# Patient Record
Sex: Female | Born: 1995 | Race: White | Hispanic: No | Marital: Single | Smoking: Never smoker
Health system: Southern US, Community
[De-identification: ages and names within clinical notes are randomized; demographics above are authoritative.]

## PROBLEM LIST (undated history)

## (undated) DIAGNOSIS — F419 Anxiety disorder, unspecified: Secondary | ICD-10-CM

## (undated) DIAGNOSIS — F32A Depression, unspecified: Secondary | ICD-10-CM

## (undated) HISTORY — PX: TYMPANOSTOMY TUBE PLACEMENT: SHX32

## (undated) HISTORY — DX: Depression, unspecified: F32.A

## (undated) HISTORY — PX: WISDOM TOOTH EXTRACTION: SHX21

---
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
## 2017-01-08 ENCOUNTER — Encounter (HOSPITAL_COMMUNITY): Payer: Self-pay | Admitting: Emergency Medicine

## 2017-01-08 DIAGNOSIS — R079 Chest pain, unspecified: Secondary | ICD-10-CM | POA: Diagnosis present

## 2017-01-08 DIAGNOSIS — R0789 Other chest pain: Secondary | ICD-10-CM | POA: Insufficient documentation

## 2017-01-08 DIAGNOSIS — Z79899 Other long term (current) drug therapy: Secondary | ICD-10-CM | POA: Insufficient documentation

## 2017-01-08 NOTE — ED Triage Notes (Signed)
Pt states that she was started on minipress for nightmares on Thursday and had a pounding heartbeat at that time. Also states that her prozac was doubled. States that she has had chest pain x 3 hours and a heavy feeling in her L arm. Alert and oriented.

## 2017-01-09 ENCOUNTER — Emergency Department (HOSPITAL_COMMUNITY)
Admission: EM | Admit: 2017-01-09 | Discharge: 2017-01-09 | Disposition: A | Discharge status: Home / Self Care | Payer: 59 | Attending: Emergency Medicine | Admitting: Emergency Medicine

## 2017-01-09 DIAGNOSIS — R0789 Other chest pain: Secondary | ICD-10-CM

## 2017-01-09 HISTORY — DX: Anxiety disorder, unspecified: F41.9

## 2017-01-09 NOTE — ED Notes (Signed)
Bed: WA14 Expected date:  Expected time:  Means of arrival:  Comments: TR 

## 2017-01-09 NOTE — ED Provider Notes (Signed)
[facility] DEPT Provider Note: Anne Dell, MD, FACEP  CSN: [id number] MRN: [id number] ARRIVAL: 01/08/17 at 2040 ROOM: WA14/WA14   CHIEF COMPLAINT  Chest Pain   HISTORY OF PRESENT ILLNESS  Anne Duarte is a 21 y.o. female who was started on Minipress for nightmares 6 days ago. She developed heart palpitations, by which she means a pounding heartbeat, and immediately discontinued the drug. She also had her Prozac dose doubled from 20 milligrams daily to 40 milligrams daily. She has not had Minipress since that first dose.  She is here with chest pain that began yesterday evening about 6 PM. The pain was located in the left upper chest. It was well localized. It was mild and dull in nature. It was somewhat worse with expiration. There was no shortness of breath but she did feel her chest and left arm felt heavier than usual. There was no nausea or diaphoresis. The chest pain resolved on its own about 1 AM and she has not had any since. She has no history of mitral valve prolapse or similar pain.   Past Medical History:  Diagnosis Date  . Anxiety     Past Surgical History:  Procedure Laterality Date  . TYMPANOSTOMY TUBE PLACEMENT    . WISDOM TOOTH EXTRACTION      No family history on file.  Social History  Substance Use Topics  . Smoking status: Not on file  . Smokeless tobacco: Not on file  . Alcohol use Not on file    Prior to Admission medications   Medication Sig Start Date End Date Taking? Authorizing Provider  acetaminophen (TYLENOL) 500 MG tablet Take 1,000 mg by mouth every 6 (six) hours as needed for moderate pain.   Yes Historical Provider, MD  FLUoxetine (PROZAC) 40 MG capsule Take 40 mg by mouth daily.   Yes Historical Provider, MD    Allergies Prazosin hcl   REVIEW OF SYSTEMS  Negative except as noted here or in the History of Present Illness.   PHYSICAL EXAMINATION  Initial Vital Signs Blood pressure 106/74, pulse 67, temperature 98.3 F (36.8  C), temperature source Oral, resp. rate 19, last menstrual period 01/07/2017, SpO2 100 %.  Examination General: Well-developed, well-nourished female in no acute distress; appearance consistent with age of record HENT: normocephalic; atraumatic Eyes: pupils equal, round and reactive to light; extraocular muscles intact Neck: supple Heart: regular rate and rhythm; no murmurs, rubs or gallops Lungs: clear to auscultation bilaterally Abdomen: soft; nondistended; nontender; no masses or hepatosplenomegaly; bowel sounds present Extremities: No deformity; full range of motion; pulses normal Neurologic: Awake, alert and oriented; motor function intact in all extremities and symmetric; no facial droop Skin: Warm and dry Psychiatric: Normal mood and affect   RESULTS  Summary of this visit's results, reviewed by myself:   EKG Interpretation  Date/Time:  Tuesday January 08 2017 20:59:24 EDT Ventricular Rate:  90 PR Interval:    QRS Duration: 85 QT Interval:  361 QTC Calculation: 442 R Axis:   59 Text Interpretation:  Sinus rhythm RSR' in V1 or V2, right VCD or RVH Borderline T abnormalities, anterior leads Previously normal Confirmed by Read Drivers  MD, Jonny Ruiz ([id number]) on 01/09/2017 4:46:13 AM       EKG Interpretation  Date/Time:  Wednesday January 09 2017 04:55:12 EDT Ventricular Rate:  73 PR Interval:    QRS Duration: 92 QT Interval:  392 QTC Calculation: 432 R Axis:   48 Text Interpretation:  Sinus rhythm RSR' in V1 or V2,  right VCD or RVH No significant change was found Confirmed by Farha Dano  MD, Jonny RuizJOHN ([id number]) on 01/09/2017 5:03:02 AM       Laboratory Studies: No results found for this or any previous visit (from the past 24 hour(s)). Imaging Studies: No results found.  ED COURSE  Nursing notes and initial vitals signs, including pulse oximetry, reviewed.  Vitals:   01/08/17 2325 01/09/17 0226 01/09/17 0335 01/09/17 0400  BP: 106/65 108/71  106/74  Pulse: 76 80  67  Resp: 18 18  19    Temp: 98.4 F (36.9 C) 98.3 F (36.8 C)    TempSrc: Oral Oral    SpO2: 99% 99% 99% 100%   5:04 AM Patient's chest pain is atypical for coronary pain and she is an otherwise healthy young female with no significant risk factors. The location and nature the pain are more consistent with mitral valve prolapse. We will refer to cardiology for outpatient evaluation.  PROCEDURES    ED DIAGNOSES     ICD-9-CM ICD-10-CM   1. Atypical chest pain 786.59 R07.89        Paula LibraJohn Chivonne Rascon, MD 01/09/17 [phone]

## 2017-07-03 ENCOUNTER — Emergency Department (HOSPITAL_COMMUNITY)
Admission: EM | Admit: 2017-07-03 | Discharge: 2017-07-04 | Disposition: A | Discharge status: Other Acute Inpatient Hospital | Payer: 59 | Attending: Emergency Medicine | Admitting: Emergency Medicine

## 2017-07-03 ENCOUNTER — Encounter (HOSPITAL_COMMUNITY): Payer: Self-pay | Admitting: *Deleted

## 2017-07-03 DIAGNOSIS — Z046 Encounter for general psychiatric examination, requested by authority: Secondary | ICD-10-CM | POA: Diagnosis not present

## 2017-07-03 DIAGNOSIS — F329 Major depressive disorder, single episode, unspecified: Secondary | ICD-10-CM | POA: Diagnosis present

## 2017-07-03 DIAGNOSIS — R45851 Suicidal ideations: Secondary | ICD-10-CM

## 2017-07-03 DIAGNOSIS — Z79899 Other long term (current) drug therapy: Secondary | ICD-10-CM | POA: Diagnosis not present

## 2017-07-03 LAB — COMPREHENSIVE METABOLIC PANEL
ALK PHOS: 74 U/L (ref 38–126)
ALT: 16 U/L (ref 14–54)
AST: 18 U/L (ref 15–41)
Albumin: 4.3 g/dL (ref 3.5–5.0)
Anion gap: 10 (ref 5–15)
BUN: 12 mg/dL (ref 6–20)
CALCIUM: 9.4 mg/dL (ref 8.9–10.3)
CHLORIDE: 106 mmol/L (ref 101–111)
CO2: 23 mmol/L (ref 22–32)
CREATININE: 0.61 mg/dL (ref 0.44–1.00)
GFR calc Af Amer: >60 mL/min (ref >60)
GFR calc non Af Amer: >60 mL/min (ref >60)
GLUCOSE: 83 mg/dL (ref 65–99)
Potassium: 3.6 mmol/L (ref 3.5–5.1)
SODIUM: 139 mmol/L (ref 135–145)
Total Bilirubin: 0.6 mg/dL (ref 0.3–1.2)
Total Protein: 7.4 g/dL (ref 6.5–8.1)

## 2017-07-03 LAB — CBC
HEMATOCRIT: 39.1 % (ref 36.0–46.0)
HEMOGLOBIN: 13.2 g/dL (ref 12.0–15.0)
MCH: 28 pg (ref 26.0–34.0)
MCHC: 33.8 g/dL (ref 30.0–36.0)
MCV: 82.8 fL (ref 78.0–100.0)
Platelets: 237 10*3/uL (ref 150–400)
RBC: 4.72 MIL/uL (ref 3.87–5.11)
RDW: 13.8 % (ref 11.5–15.5)
WBC: 7.9 10*3/uL (ref 4.0–10.5)

## 2017-07-03 LAB — RAPID URINE DRUG SCREEN, HOSP PERFORMED
AMPHETAMINES: NOT DETECTED
BARBITURATES: NOT DETECTED
Benzodiazepines: NOT DETECTED
Cocaine: NOT DETECTED
Opiates: NOT DETECTED
TETRAHYDROCANNABINOL: NOT DETECTED

## 2017-07-03 LAB — ACETAMINOPHEN LEVEL: Acetaminophen (Tylenol), Serum: <10 ug/mL — ABNORMAL LOW (ref 10–30)

## 2017-07-03 LAB — I-STAT BETA HCG BLOOD, ED (MC, WL, AP ONLY): I-stat hCG, quantitative: <5 m[IU]/mL (ref <5)

## 2017-07-03 LAB — ETHANOL: Alcohol, Ethyl (B): <5 mg/dL (ref <5)

## 2017-07-03 LAB — SALICYLATE LEVEL: Salicylate Lvl: <7 mg/dL (ref 2.8–30.0)

## 2017-07-03 MED ORDER — DIPHENHYDRAMINE HCL 25 MG PO CAPS: 25.0000 mg | ORAL_CAPSULE | Freq: Four times a day (QID) | ORAL | Status: DC | PRN

## 2017-07-03 MED ORDER — SERTRALINE HCL 50 MG PO TABS
50.0000 mg | ORAL_TABLET | Freq: Every day | ORAL | Status: DC
  Administered 2017-07-04: 50 mg via ORAL
  Filled 2017-07-03: qty 1

## 2017-07-03 MED ORDER — LORATADINE 10 MG PO TABS
10.0000 mg | ORAL_TABLET | Freq: Every day | ORAL | Status: DC
  Administered 2017-07-04: 10 mg via ORAL
  Filled 2017-07-03: qty 1

## 2017-07-03 MED ORDER — HYDROXYZINE HCL 25 MG PO TABS
25.0000 mg | ORAL_TABLET | Freq: Every day | ORAL | Status: DC
  Administered 2017-07-04: 25 mg via ORAL
  Filled 2017-07-03: qty 1

## 2017-07-03 NOTE — ED Notes (Signed)
[facility] Expected date: 07/03/17 Expected time:  Means of arrival:  Comments: Triage 4

## 2017-07-03 NOTE — ED Notes (Signed)
Bed: [facility] Expected date:  Expected time:  Means of arrival:  Comments: 

## 2017-07-03 NOTE — BH Assessment (Signed)
Tele Assessment Note     Anne Duarte is an 21 y.o. female presenting to [facility] after an evaluation at [facility] for crisis. 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 The patient stated she is having suicidal thoughts with a plan to OD. The patient reports access to medications. She states having a history of suicidal thoughts but no previous attempts or intent. However, she currently states an inability to contract for safety. The patient is a sophomore in college. Stressors involved academic difficulty and uncertainty about her future. Denies HI. Denies A/V. Denies SA.  The patient is from [place], [place]. Lives with her parents, has no siblings. Reports depressive symptoms over the summer: did not work, lacked productive activity, sleep pattern was off, insomnia, poor energy. The patient has unremarkable appearance, good eye contact, freedom of movement, logical speech, alert, depressed mood and affect.  Reports panic attacks every other day, at night before she falls asleep. Last attack was Monday.   The patient has partial judgment, fair impulse control, and fair insight, poor appetite, increased sleep, not bathing at times. Denies prior inpatient treatment.   Shuvon Rankin, FNP-BC recommends inpatient treatment. Patient will transfer to OBS at [facility].   Diagnosis: MDD, recurrent severe, without psychosis; GAD  Past Medical History:  Past Medical History:  Diagnosis Date  . Anxiety     Past Surgical History:  Procedure Laterality Date  . TYMPANOSTOMY TUBE PLACEMENT    . WISDOM TOOTH EXTRACTION      Family History: No family history on file.  Social History:  reports that she has never smoked. She has never used smokeless tobacco. She reports that she does not drink alcohol or use drugs.  Additional Social History:     CIWA: CIWA-Ar BP: 116/68 Pulse Rate: 80 COWS:    PATIENT STRENGTHS: (choose at least two) Average  or above average intelligence General fund of knowledge  Allergies:  Allergies  Allergen Reactions  . Prazosin Hcl Other (See Comments)    Rapid heart beat/out of breath    Home Medications:  (Not in a hospital admission)  OB/GYN Status:  Patient's last menstrual period was 06/02/2017.  General Assessment Data Location of Assessment: [facility] Assessment Services TTS Assessment: In system Is this a Tele or Face-to-Face Assessment?: Tele Assessment Is this an Initial Assessment or a Re-assessment for this encounter?: Initial Assessment Marital status: Single Maiden name: Belland Is patient pregnant?: No Pregnancy Status: No Living Arrangements: Other (Comment) (dorm) Can pt return to current living arrangement?: Yes Admission Status: Voluntary Is patient capable of signing voluntary admission?: Yes Referral Source: Self/Family/Friend Insurance type: Product/process development scientist Exam [facility] Walk-in ONLY) Medical Exam completed: Yes  Crisis Care Plan Living Arrangements: Other (Comment) (dorm) Name of Psychiatrist: [facility]- [facility]oloft Name of Therapist: [facility]  Education Status Is patient currently in school?: Yes Highest grade of school patient has completed: [employer] sophomore  Risk to self with the past 6 months Suicidal Ideation: Yes-Currently Present Has patient been a risk to self within the past 6 months prior to admission? : Yes Suicidal Intent: Yes-Currently Present Has patient had any suicidal intent within the past 6 months prior to admission? : No Is patient at risk for suicide?: Yes Suicidal Plan?: Yes-Currently Present Has patient had any suicidal plan within the past 6 months prior to admission? : No Specify Current Suicidal Plan: OD Access to Means: Yes Specify Access to Suicidal Means: reports having pills  What has been your use of drugs/alcohol within the last 12 months?: n/a Previous Attempts/Gestures: No How many times?: 0 Intentional Self Injurious Behavior:  None Family Suicide History: Yes (mom, 2017) Recent stressful life event(s): Other (Comment) (not doing well in school) Persecutory voices/beliefs?: No Depression: Yes Depression Symptoms: Insomnia, Fatigue Substance abuse history and/or treatment for substance abuse?: No Suicide prevention information given to non-admitted patients: Not applicable  Risk to Others within the past 6 months Homicidal Ideation: No Does patient have any lifetime risk of violence toward others beyond the six months prior to admission? : No Thoughts of Harm to Others: No Current Homicidal Intent: No Current Homicidal Plan: No Access to Homicidal Means: No History of harm to others?: No Assessment of Violence: None Noted Does patient have access to weapons?: No Criminal Charges Pending?: No Does patient have a court date: No Is patient on probation?: No  Psychosis Hallucinations: None noted Delusions: None noted  Mental Status Report Appearance/Hygiene: Unremarkable Eye Contact: Good Motor Activity: Freedom of movement Speech: Logical/coherent Level of Consciousness: Alert Mood: Depressed Affect: Depressed Anxiety Level: Panic Attacks Panic attack frequency: every few days Most recent panic attack: Monday night (when she got back to school) Thought Processes: Coherent, Relevant Judgement: Partial Orientation: Person, Place, Time, Situation Obsessive Compulsive Thoughts/Behaviors: Unable to Assess  Cognitive Functioning Concentration: Normal Memory: Recent Intact, Remote Intact IQ: Average Insight: Fair Impulse Control: Fair Appetite: Poor Weight Loss: 0 Weight Gain: 0 Sleep: Increased Total Hours of Sleep:  (UTA, up at night, sleeps during the day) Vegetative Symptoms: Not bathing  ADLScreening [facility] Assessment Services) Patient's cognitive ability adequate to safely complete daily activities?: Yes Patient able to express need for assistance with ADLs?: Yes Independently performs  ADLs?: Yes (appropriate for developmental age)  Prior Inpatient Therapy Prior Inpatient Therapy: No  Prior Outpatient Therapy Prior Outpatient Therapy: Yes Prior Therapy Dates: ongoing Prior Therapy Facilty/Provider(s): [facility] Reason for Treatment: depression Does patient have an ACCT team?: No Does patient have Intensive In-House Services?  : No Does patient have [facility] services? : No Does patient have P4CC services?: No  ADL Screening (condition at time of admission) Patient's cognitive ability adequate to safely complete daily activities?: Yes Patient able to express need for assistance with ADLs?: Yes Independently performs ADLs?: Yes (appropriate for developmental age)                  Additional Information 1:1 In Past 12 Months?: No CIRT Risk: No Elopement Risk: No Does patient have medical clearance?: Yes     Disposition:  Disposition Initial Assessment Completed for this Encounter: Yes Disposition of Patient: Inpatient treatment program Type of inpatient treatment program: Adult    Westley Hummershley H Iantha Titsworth 07/03/2017 5:56 PM

## 2017-07-03 NOTE — ED Notes (Signed)
Visitor at bedside.

## 2017-07-03 NOTE — ED Triage Notes (Signed)
Pt sent here from [facility] due to SI. Pt states she became more stressed last week when she got behind in school. Pt states she would overdose on medication. Pt has been taking her Zoloft as prescribed.

## 2017-07-03 NOTE — ED Notes (Signed)
Belongings taken home with parents.

## 2017-07-03 NOTE — ED Notes (Signed)
Patient continues to endorse SI with a plan to overdose. Patient denies HI and AVH at this time. Plan of care discussed. Encouragement and support provided and safety maintain. Q 15 min safety checks remain in place.

## 2017-07-03 NOTE — ED Notes (Signed)
Pt admitted to room #43. Pt behavior cooperative, pleasant on approach. Pt endorsing SI with plan to OD on medication. 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 Pt identifies school as primary stressor. Pt identifies parents as primary support system. Pt denies hx of suicide attempt. Pt reports she is compliant with medication regimen. Encouragement and support provided. Special checks q 15 mins in place for safety, Video monitoring in place. Will continue to monitor.

## 2017-07-03 NOTE — ED Provider Notes (Signed)
[facility]-EM[facility]Y DEPT Provider Note   CSN: [id number] Arrival date & time: 07/03/17  1437     History   Chief Complaint Chief Complaint  Patient presents with  . Suicidal    HPI Anne Duarte is a 21 y.o. female.  She presents for "active suicidal thoughts."  She states that previously her thoughts were "passive."  She has chronic passive suicidal ideation, for which she takes Zoloft.  She has not seen a counselor recently, then went to see one today at school.  She states that she experienced a "crisis," today, when her roommate woke her up after only 4 hours of sleep this morning.  She feels like she is been actively suicidal, for 1 week.  She has thoughts of taking any of a number of different medications to kill herself.  She has thought about which ones would be better to take in order to dispatch herself.  She denies taking anything as of yet.  She does not use drugs or alcohol.  She has no prior suicidal attempts.  She has chronic depressive thoughts, and experienced some losses last year with her mother attempting suicide, and her uncle dying.  She had a sinus infection about 2 weeks ago which was treated with antibiotics, and symptomatic treatments, and wonders if these medicines contributed to her thoughts of suicide.  She denies any current fever, chills, cough, chest pain, focal weakness or paresthesia.  She has trouble sleeping chronically and typically stays up very late at night.  She has been so fatigued recently that she cannot even walk to the cafeteria to get food.  She has been sustaining herself by "eating snacks."  She is a sophomore at a local college.  Her parents live in an adjacent town, [place], about 20 miles away.  They are here with her today.  There are no other known modifying factors.  HPI  Past Medical History:  Diagnosis Date  . Anxiety     There are no active problems to display for this patient.   Past Surgical History:  Procedure Laterality Date    . TYMPANOSTOMY TUBE PLACEMENT    . WISDOM TOOTH EXTRACTION      OB History    No data available       Home Medications    Prior to Admission medications   Medication Sig Start Date End Date Taking? Authorizing Provider  acetaminophen (TYLENOL) 500 MG tablet Take 1,000 mg by mouth every 6 (six) hours as needed for moderate pain.   Yes [provider]  cetirizine (ZYRTEC) 10 MG tablet Take 10 mg by mouth daily.   Yes [provider]  diphenhydrAMINE (BENADRYL) 25 mg capsule Take 25 mg by mouth every 6 (six) hours as needed (migraines).   Yes [provider]  hydrOXYzine (ATARAX/VISTARIL) 25 MG tablet Take 1 tablet by mouth daily. 04/23/17  Yes [provider]  pseudoephedrine (SUDAFED) 30 MG tablet Take 30 mg by mouth daily as needed for congestion.   Yes [provider]  sertraline (ZOLOFT) 50 MG tablet Take 50 mg by mouth daily. 06/17/17  Yes [provider]    Family History No family history on file.  Social History Social History  Substance Use Topics  . Smoking status: Never Smoker  . Smokeless tobacco: Never Used  . Alcohol use No     Allergies   Prazosin hcl   Review of Systems Review of Systems  All other systems reviewed and are negative.  Physical Exam Updated Vital Signs BP 105/73 (BP Location: Left Arm)   Pulse 79   Temp 98.8 F (37.1 C) (Oral)   Resp 16   Ht 5\' 5"  (1.651 m)   Wt 77.1 kg (170 lb)   LMP 06/02/2017   SpO2 98%   BMI 28.29 kg/m   Physical Exam  Constitutional: She is oriented to person, place, and time. She appears well-developed and well-nourished.  HENT:  Head: Normocephalic and atraumatic.  Eyes: Pupils are equal, round, and reactive to light. Conjunctivae and EOM are normal.  Neck: Normal range of motion and phonation normal. Neck supple.  Cardiovascular: Normal rate and regular rhythm.   Pulmonary/Chest: Effort normal and breath sounds normal. She exhibits no  tenderness.  Musculoskeletal: Normal range of motion.  Neurological: She is alert and oriented to person, place, and time. She exhibits normal muscle tone.  No dysarthria or aphasia  Skin: Skin is warm and dry.  Psychiatric: Her behavior is normal. Judgment and thought content normal.  She appears somewhat depressed.  She is not responding to internal stimuli.  She does not appear anxious.  Nursing note and vitals reviewed.    ED Treatments / Results  Labs (all labs ordered are listed, but only abnormal results are displayed) Labs Reviewed  ACETAMINOPHEN LEVEL - Abnormal; Notable for the following:       Result Value   Acetaminophen (Tylenol), Serum <10 (*)    All other components within normal limits  COMPREHENSIVE METABOLIC PANEL  ETHANOL  SALICYLATE LEVEL  CBC  RAPID URINE DRUG SCREEN, HOSP PERFORMED  I-STAT BETA HCG BLOOD, ED ([facility], [facility], [facility] ONLY)    EKG  EKG Interpretation None       Radiology No results found.  Procedures Procedures (including critical care time)  Medications Ordered in ED Medications  loratadine (CLARITIN) tablet 10 mg (10 mg Oral Refused 07/03/17 1813)  diphenhydrAMINE (BENADRYL) capsule 25 mg (not administered)  hydrOXYzine (ATARAX/VISTARIL) tablet 25 mg (25 mg Oral Refused 07/03/17 1813)  sertraline (ZOLOFT) tablet 50 mg (50 mg Oral Not Given 07/03/17 1813)     Initial Impression / Assessment and Plan / ED Course  I have reviewed the triage vital signs and the nursing notes.  Pertinent labs & imaging results that were available during my care of the patient were reviewed by me and considered in my medical decision making (see chart for details).  Clinical Course as of Jul 04 1847  Wed Jul 03, 2017  1844 At this point, with urine drug screen pending, the patient is medically cleared for treatment by psychiatry.  [EW]    Clinical Course User Index [EW] Mancel Bale, MD     Patient Vitals for the past 24 hrs:  BP Temp Temp src Pulse Resp  SpO2 Height Weight  07/03/17 1755 105/73 - - 79 16 98 % - -  07/03/17 1534 116/68 98.8 F (37.1 C) Oral 80 19 100 % 5\' 5"  (1.651 m) 77.1 kg (170 lb)    TTS Consult  .   Final Clinical Impressions(s) / ED Diagnoses   Final diagnoses:  Suicidal ideation    Suicidal ideation chronic, worsening, now "active."  Patient requires evaluation by psychiatry prior to disposition.  Nursing Notes Reviewed/ Care Coordinated Applicable Imaging Reviewed Interpretation of Laboratory Data incorporated into ED treatment  Plan: As per TTS in conjunction with oncoming provider team  New Prescriptions New Prescriptions   No medications on file     Mancel Bale, MD 07/03/17 1851

## 2017-07-04 ENCOUNTER — Inpatient Hospital Stay (HOSPITAL_COMMUNITY)
Admission: EM | Admit: 2017-07-04 | Discharge: 2017-07-09 | DRG: 885 | Disposition: A | Discharge status: Home / Self Care | Payer: 59 | Source: Intra-hospital | Attending: Psychiatry | Admitting: Psychiatry

## 2017-07-04 ENCOUNTER — Encounter (HOSPITAL_COMMUNITY): Payer: Self-pay | Admitting: *Deleted

## 2017-07-04 DIAGNOSIS — Z733 Stress, not elsewhere classified: Secondary | ICD-10-CM | POA: Diagnosis not present

## 2017-07-04 DIAGNOSIS — F419 Anxiety disorder, unspecified: Secondary | ICD-10-CM | POA: Diagnosis not present

## 2017-07-04 DIAGNOSIS — F411 Generalized anxiety disorder: Secondary | ICD-10-CM | POA: Diagnosis present

## 2017-07-04 DIAGNOSIS — F322 Major depressive disorder, single episode, severe without psychotic features: Secondary | ICD-10-CM | POA: Diagnosis present

## 2017-07-04 DIAGNOSIS — Z79899 Other long term (current) drug therapy: Secondary | ICD-10-CM

## 2017-07-04 DIAGNOSIS — R4587 Impulsiveness: Secondary | ICD-10-CM

## 2017-07-04 DIAGNOSIS — F515 Nightmare disorder: Secondary | ICD-10-CM

## 2017-07-04 DIAGNOSIS — G4700 Insomnia, unspecified: Secondary | ICD-10-CM | POA: Diagnosis not present

## 2017-07-04 DIAGNOSIS — R450 Nervousness: Secondary | ICD-10-CM

## 2017-07-04 DIAGNOSIS — Z888 Allergy status to other drugs, medicaments and biological substances status: Secondary | ICD-10-CM

## 2017-07-04 DIAGNOSIS — F332 Major depressive disorder, recurrent severe without psychotic features: Principal | ICD-10-CM

## 2017-07-04 MED ORDER — HYDROXYZINE HCL 25 MG PO TABS: 25.0000 mg | ORAL_TABLET | Freq: Every day | ORAL | Status: DC

## 2017-07-04 MED ORDER — HYDROXYZINE HCL 25 MG PO TABS: 25.0000 mg | ORAL_TABLET | Freq: Four times a day (QID) | ORAL | Status: DC | PRN

## 2017-07-04 MED ORDER — ALUM & MAG HYDROXIDE-SIMETH 200-200-20 MG/5ML PO SUSP: 30.0000 mL | ORAL | Status: DC | PRN

## 2017-07-04 MED ORDER — SERTRALINE HCL 50 MG PO TABS: 50.0000 mg | ORAL_TABLET | Freq: Every day | ORAL | Status: DC

## 2017-07-04 MED ORDER — LORATADINE 10 MG PO TABS
10.0000 mg | ORAL_TABLET | Freq: Every day | ORAL | Status: DC
  Administered 2017-07-05 – 2017-07-09 (×5): 10 mg via ORAL
  Filled 2017-07-04 (×8): qty 1

## 2017-07-04 MED ORDER — ACETAMINOPHEN 325 MG PO TABS
650.0000 mg | ORAL_TABLET | Freq: Four times a day (QID) | ORAL | Status: DC | PRN
  Administered 2017-07-04 – 2017-07-06 (×2): 650 mg via ORAL
  Filled 2017-07-04 (×2): qty 2

## 2017-07-04 MED ORDER — LORATADINE 10 MG PO TABS: 10.0000 mg | ORAL_TABLET | Freq: Every day | ORAL | Status: DC

## 2017-07-04 MED ORDER — DIPHENHYDRAMINE HCL 25 MG PO CAPS: 25.0000 mg | ORAL_CAPSULE | Freq: Four times a day (QID) | ORAL | Status: DC | PRN

## 2017-07-04 MED ORDER — SERTRALINE HCL 50 MG PO TABS
50.0000 mg | ORAL_TABLET | Freq: Every day | ORAL | Status: DC
  Administered 2017-07-05 – 2017-07-06 (×2): 50 mg via ORAL
  Filled 2017-07-04 (×5): qty 1

## 2017-07-04 MED ORDER — HYDROXYZINE HCL 25 MG PO TABS
25.0000 mg | ORAL_TABLET | Freq: Every day | ORAL | Status: DC
  Administered 2017-07-05 – 2017-07-06 (×2): 25 mg via ORAL
  Filled 2017-07-04 (×5): qty 1

## 2017-07-04 MED ORDER — MAGNESIUM HYDROXIDE 400 MG/5ML PO SUSP: 30.0000 mL | Freq: Every day | ORAL | Status: DC | PRN

## 2017-07-04 MED ORDER — TRAZODONE HCL 50 MG PO TABS: 50.0000 mg | ORAL_TABLET | Freq: Every evening | ORAL | Status: DC | PRN

## 2017-07-04 NOTE — BH Assessment (Signed)
[facility] Assessment Progress Note  Per Shuvon Rankin, FNP-BC, this pt [facility].  Berneice Heinrichina Tate, RN, [facility] Obs [facility].  Pt has signed Voluntary Admission and Consent for Treatment, as well as Consent to Release Information to her parents and to [facility], and a notification call has been placed to the latter.  Signed [facility].  Pt's nurse, Diane, has been notified, and agrees to send original paperwork along with pt via [facility], and to call report to [phone] or [phone].  Doylene Canninghomas Skyla Champagne, MA Triage Specialist [phone]

## 2017-07-04 NOTE — Progress Notes (Signed)
Patient ID: Anne Duarte, female   DOB: 05/09/1996, [address]   MRN: [id number]  Patient admitted to observation unit after stating that she had a plan to OD on her meds. Patient reported increased depression, sadness, hopelessness, worthlessness, anhedonia and increased sleep since going back to school.  Patient reports that she is stressed out by school and really does not want to be in college. She states she wants to drop out but can't because her parents have paid for it and she would feel guilty if she wasted their money. She has not attempted suicide in the past or been hospitalized. She currently denies SI, HI, or AVH. No prior hx of hospitalization. No hx of abuse. Drug screen negative, no alcohol in system. Patient has no hx of cutting. [facility]. Oriented to unit.

## 2017-07-04 NOTE — ED Notes (Signed)
Pt transported to [facility] by [facility]. All belongings returned to pt who signed for same.  

## 2017-07-04 NOTE — H&P (Signed)
[facility] Provider Admission PAA/H&P  Patient Identification: Anne Duarte MRN:  [id number] Date of Evaluation:  07/04/2017 Chief Complaint:  MDD Principal Diagnosis: MDD (major depressive disorder), recurrent severe, without psychosis ([place]) Diagnosis:   Patient Active Problem List   Diagnosis Date Noted  . MDD (major depressive disorder), recurrent severe, without psychosis ([place]) [F33.2] 07/04/2017   History of Present Illness: Patient reports that she has worsening depression and lack of energy.  Reports that her stressors are related to school "I'm not doing well and I'm not good in math.  I can't stay awake in 3 hour long class and that makes me late or to tired to attend my other classes."  Patient reports that she started to have suicidal thoughts several days ago and went to NP at [facility] who referred her to ED related to having a plan (overdose) and means (her medication).  Patient continues to endorse suicidal ideation with plan to overdose but states that the intensity has decrease since she is not home and not attending classes right now.  Patient denies history of inpatient or outpatient services until she was in college because her mother didn't believe in it.  States that she has been having problems with depression since she was in the eighth grade.  Patient denies homicidal ideation, psychosis, and paranoia; but states that she has been having random nightmares about being abducted, raped "and just random things."  Denies any traumatic event that could cause the nightmares.   Associated Signs/Symptoms: Depression Symptoms:  depressed mood, anhedonia, insomnia, fatigue, difficulty concentrating, suicidal thoughts with specific plan, anxiety, panic attacks, (Hypo) Manic Symptoms:  Distractibility, Anxiety Symptoms:  Excessive Worry, Psychotic Symptoms:  Denies PTSD Symptoms: Denies Total Time spent with patient: 45 minutes  Past Psychiatric History: Depression,   Is  the patient at risk to self? Yes.    Has the patient been a risk to self in the past 6 months? No.  Has the patient been a risk to self within the distant past? No.  Is the patient a risk to others? No.  Has the patient been a risk to others in the past 6 months? No.  Has the patient been a risk to others within the distant past? No.   Prior Inpatient Therapy:   Denies Prior Outpatient Therapy:   [facility]  Alcohol Screening:   Negative Substance Abuse History in the last 12 months:  No. Consequences of Substance Abuse: NA Previous Psychotropic Medications: Yes Zoloft Psychological Evaluations: No  Past Medical History:  Past Medical History:  Diagnosis Date  . Anxiety     Past Surgical History:  Procedure Laterality Date  . TYMPANOSTOMY TUBE PLACEMENT    . WISDOM TOOTH EXTRACTION     Family History: History reviewed. No pertinent family history. Family Psychiatric History: Denies Tobacco Screening:   Social History:  History  Alcohol Use No     History  Drug Use No    Additional Social History:      Pain Medications: see MAR Prescriptions: see MAR Over the Counter: see MAR History of alcohol / drug use?: No history of alcohol / drug abuse                    Allergies:   Allergies  Allergen Reactions  . Prazosin Hcl Other (See Comments)    Rapid heart beat/out of breath   Lab Results:  Results for orders placed or performed during the hospital encounter of 07/03/17 (from the past  48 hour(s))  Comprehensive metabolic panel     Status: None   Collection Time: 07/03/17  5:08 PM  Result Value Ref Range   Sodium 139 135 - 145 mmol/L   Potassium 3.6 3.5 - 5.1 mmol/L   Chloride 106 101 - 111 mmol/L   CO2 23 22 - 32 mmol/L   Glucose, Bld 83 65 - 99 mg/dL   BUN 12 6 - 20 mg/dL   Creatinine, Ser 0.61 0.44 - 1.00 mg/dL   Calcium 9.4 8.9 - 10.3 mg/dL   Total Protein 7.4 6.5 - 8.1 g/dL   Albumin 4.3 3.5 - 5.0 g/dL   AST 18 15 - 41 U/L   ALT 16 14 - 54 U/L    Alkaline Phosphatase 74 38 - 126 U/L   Total Bilirubin 0.6 0.3 - 1.2 mg/dL   GFR calc non Af Amer >60 >60 mL/min   GFR calc Af Amer >60 >60 mL/min    Comment: (NOTE) The eGFR has been calculated using the CKD EPI equation. This calculation has not been validated in all clinical situations. eGFR's persistently <60 mL/min signify possible Chronic Kidney Disease.    Anion gap 10 5 - 15  Ethanol     Status: None   Collection Time: 07/03/17  5:08 PM  Result Value Ref Range   Alcohol, Ethyl (B) <5 <5 mg/dL    Comment:        LOWEST DETECTABLE LIMIT FOR SERUM ALCOHOL IS 5 mg/dL FOR MEDICAL PURPOSES ONLY   Salicylate level     Status: None   Collection Time: 07/03/17  5:08 PM  Result Value Ref Range   Salicylate Lvl <2.8 2.8 - 30.0 mg/dL  Acetaminophen level     Status: Abnormal   Collection Time: 07/03/17  5:08 PM  Result Value Ref Range   Acetaminophen (Tylenol), Serum <10 (L) 10 - 30 ug/mL    Comment:        THERAPEUTIC CONCENTRATIONS VARY SIGNIFICANTLY. A RANGE OF 10-30 ug/mL MAY BE AN EFFECTIVE CONCENTRATION FOR MANY PATIENTS. HOWEVER, SOME ARE BEST TREATED AT CONCENTRATIONS OUTSIDE THIS RANGE. ACETAMINOPHEN CONCENTRATIONS >150 ug/mL AT 4 HOURS AFTER INGESTION AND >50 ug/mL AT 12 HOURS AFTER INGESTION ARE OFTEN ASSOCIATED WITH TOXIC REACTIONS.   cbc     Status: None   Collection Time: 07/03/17  5:08 PM  Result Value Ref Range   WBC 7.9 4.0 - 10.5 K/uL   RBC 4.72 3.87 - 5.11 MIL/uL   Hemoglobin 13.2 12.0 - 15.0 g/dL   HCT 39.1 36.0 - 46.0 %   MCV 82.8 78.0 - 100.0 fL   MCH 28.0 26.0 - 34.0 pg   MCHC 33.8 30.0 - 36.0 g/dL   RDW 13.8 11.5 - 15.5 %   Platelets 237 150 - 400 K/uL  I-Stat beta hCG blood, ED     Status: None   Collection Time: 07/03/17  5:19 PM  Result Value Ref Range   I-stat hCG, quantitative <5.0 <5 mIU/mL   Comment 3            Comment:   GEST. AGE      CONC.  (mIU/mL)   <=1 WEEK        5 - 50     2 WEEKS       50 - 500     3 WEEKS       100 -  10,000     4 WEEKS     1,000 - 30,000  FEMALE AND NON-PREGNANT FEMALE:     LESS THAN 5 mIU/mL   Rapid urine drug screen (hospital performed)     Status: None   Collection Time: 07/03/17  6:22 PM  Result Value Ref Range   Opiates NONE DETECTED NONE DETECTED   Cocaine NONE DETECTED NONE DETECTED   Benzodiazepines NONE DETECTED NONE DETECTED   Amphetamines NONE DETECTED NONE DETECTED   Tetrahydrocannabinol NONE DETECTED NONE DETECTED   Barbiturates NONE DETECTED NONE DETECTED    Comment:        DRUG SCREEN FOR MEDICAL PURPOSES ONLY.  IF CONFIRMATION IS NEEDED FOR ANY PURPOSE, NOTIFY LAB WITHIN 5 DAYS.        LOWEST DETECTABLE LIMITS FOR URINE DRUG SCREEN Drug Class       Cutoff (ng/mL) Amphetamine      1000 Barbiturate      200 Benzodiazepine   627 Tricyclics       035 Opiates          300 Cocaine          300 THC              50     Blood Alcohol level:  Lab Results  Component Value Date   ETH <5 00/93/8182    Metabolic Disorder Labs:  No results found for: HGBA1C, MPG No results found for: PROLACTIN No results found for: CHOL, TRIG, HDL, CHOLHDL, VLDL, LDLCALC  Current Medications: Current Facility-Administered Medications  Medication Dose Route Frequency Provider Last Rate Last Dose  . diphenhydrAMINE (BENADRYL) capsule 25 mg  25 mg Oral Q6H PRN Ethelene Hal, NP      . hydrOXYzine (ATARAX/VISTARIL) tablet 25 mg  25 mg Oral Daily Ethelene Hal, NP      . loratadine (CLARITIN) tablet 10 mg  10 mg Oral Daily Ethelene Hal, NP      . sertraline (ZOLOFT) tablet 50 mg  50 mg Oral Daily Ethelene Hal, NP       PTA Medications: Prescriptions Prior to Admission  Medication Sig Dispense Refill Last Dose  . acetaminophen (TYLENOL) 500 MG tablet Take 1,000 mg by mouth every 6 (six) hours as needed for moderate pain.   Past Week at Unknown time  . cetirizine (ZYRTEC) 10 MG tablet Take 10 mg by mouth daily.   07/02/2017 at Unknown time  .  diphenhydrAMINE (BENADRYL) 25 mg capsule Take 25 mg by mouth every 6 (six) hours as needed (migraines).   Past Month at Unknown time  . hydrOXYzine (ATARAX/VISTARIL) 25 MG tablet Take 1 tablet by mouth daily.   more than 30 days  . pseudoephedrine (SUDAFED) 30 MG tablet Take 30 mg by mouth daily as needed for congestion.   07/02/2017 at Unknown time  . sertraline (ZOLOFT) 50 MG tablet Take 50 mg by mouth daily.  2 07/03/2017 at Unknown time    Musculoskeletal: Strength & Muscle Tone: within normal limits Gait & Station: normal Patient leans: N/A  Psychiatric Specialty Exam: Physical Exam  Nursing note and vitals reviewed. Constitutional: She is oriented to person, place, and time.  Neck: Normal range of motion.  Respiratory: Effort normal.  Musculoskeletal: Normal range of motion.  Neurological: She is alert and oriented to person, place, and time.  Psychiatric: Her speech is normal and behavior is normal. Her mood appears anxious. Cognition and memory are normal. She expresses impulsivity. She exhibits a depressed mood. She expresses suicidal ideation. She expresses suicidal plans.    Review of Systems  Psychiatric/Behavioral: Positive for depression and suicidal ideas. Negative for hallucinations, memory loss and substance abuse. The patient is nervous/anxious and has insomnia.   All other systems reviewed and are negative.   Blood pressure (!) 94/58, pulse (!) 110, temperature 98.2 F (36.8 C), resp. rate 18, height _0  (1.651 m), weight 77.1 kg (169 lb 15.6 oz).Body mass index is 28.29 kg/m.  General Appearance: Fairly Groomed  Eye Contact:  Good  Speech:  Clear and Coherent and Normal Rate  Volume:  Normal  Mood:  Depressed  Affect:  Depressed  Thought Process:  Coherent and Goal Directed  Orientation:  Full (Time, Place, and Person)  Thought Content:  Denies hallucinations, delusions, and paranoia  Suicidal Thoughts:  Yes.  with intent/plan  Homicidal Thoughts:  No  Memory:   Immediate;   Good Recent;   Good Remote;   Good  Judgement:  Fair  Insight:  Fair  Psychomotor Activity:  Normal  Concentration:  Concentration: Good and Attention Span: Good  Recall:  Good  Fund of Knowledge:  Fair  Language:  Good  Akathisia:  No  Handed:  Right  AIMS (if indicated):     Assets:  Communication Skills Desire for Improvement Housing Social Support  ADL's:  Intact  Cognition:  WNL  Sleep:         Treatment Plan Summary: Daily contact with patient to assess and evaluate symptoms and progress in treatment, Medication management and Plan Recommend inpatient psychiatric treatment  Observation Level/Precautions:  15 minute checks Laboratory:  CBC Chemistry Profile Psychotherapy:  Individual Medications:  Zoloft 50 mg daily for Depression Vistaril 25 mg Tid prn for Anxiety Trazodone 50 mg Q hs prn for Insomnia Consultations:  Psychiatry Discharge Concerns:  Safety, stabilization, and access to medication Estimated LOS: 24 hour observation; look for inpatient psychiatric treatment placement Other:      Earleen Newport, NP 9/6/20184:31 PM

## 2017-07-05 DIAGNOSIS — R0602 Shortness of breath: Secondary | ICD-10-CM | POA: Diagnosis not present

## 2017-07-05 DIAGNOSIS — Z733 Stress, not elsewhere classified: Secondary | ICD-10-CM | POA: Diagnosis not present

## 2017-07-05 DIAGNOSIS — F39 Unspecified mood [affective] disorder: Secondary | ICD-10-CM | POA: Diagnosis not present

## 2017-07-05 DIAGNOSIS — Z79899 Other long term (current) drug therapy: Secondary | ICD-10-CM | POA: Diagnosis not present

## 2017-07-05 DIAGNOSIS — F411 Generalized anxiety disorder: Secondary | ICD-10-CM | POA: Diagnosis present

## 2017-07-05 DIAGNOSIS — R000 Tachycardia, unspecified: Secondary | ICD-10-CM | POA: Diagnosis not present

## 2017-07-05 DIAGNOSIS — Z888 Allergy status to other drugs, medicaments and biological substances status: Secondary | ICD-10-CM | POA: Diagnosis not present

## 2017-07-05 DIAGNOSIS — F332 Major depressive disorder, recurrent severe without psychotic features: Secondary | ICD-10-CM | POA: Diagnosis present

## 2017-07-05 DIAGNOSIS — R45851 Suicidal ideations: Secondary | ICD-10-CM | POA: Diagnosis not present

## 2017-07-05 DIAGNOSIS — F515 Nightmare disorder: Secondary | ICD-10-CM | POA: Diagnosis not present

## 2017-07-05 DIAGNOSIS — G4700 Insomnia, unspecified: Secondary | ICD-10-CM | POA: Diagnosis present

## 2017-07-05 DIAGNOSIS — F419 Anxiety disorder, unspecified: Secondary | ICD-10-CM | POA: Diagnosis not present

## 2017-07-05 NOTE — Progress Notes (Signed)
[facility] MD Progress Note  07/05/2017 10:25 AM Anne Duarte  MRN:  [id number] Subjective:  Patient continues to endorse suicidal ideation and unable to contract for safety if sent home.   Inpatient treatment recommended yesterday.  Patient has been accepted by [facility] waiting on bed.    Principal Problem: MDD (major depressive disorder), recurrent severe, without psychosis ([place]) Diagnosis:   Patient Active Problem List   Diagnosis Date Noted  . MDD (major depressive disorder), recurrent severe, without psychosis ([place]) [F33.2] 07/04/2017  . MDD (major depressive disorder), single episode, severe , no psychosis ([place]) [F32.2] 07/04/2017   Total Time spent with patient: 20 minutes  Past Psychiatric History: Depression, Anxiety  Past Medical History:  Past Medical History:  Diagnosis Date  . Anxiety     Past Surgical History:  Procedure Laterality Date  . TYMPANOSTOMY TUBE PLACEMENT    . WISDOM TOOTH EXTRACTION     Family History: History reviewed. No pertinent family history. Family Psychiatric  History: Denies Social History:  History  Alcohol Use No     History  Drug Use No    Social History   Social History  . Marital status: Single    Spouse name: N/A  . Number of children: N/A  . Years of education: N/A   Social History Main Topics  . Smoking status: Never Smoker  . Smokeless tobacco: Never Used  . Alcohol use No  . Drug use: No  . Sexual activity: No   Other Topics Concern  . None   Social History Narrative  . None   Additional Social History:    Pain Medications: see MAR Prescriptions: see MAR Over the Counter: see MAR History of alcohol / drug use?: No history of alcohol / drug abuse  Sleep: Fair, Reports she slept better last night  Appetite:  Fair  Current Medications: Current Facility-Administered Medications  Medication Dose Route Frequency Provider Last Rate Last Dose  . acetaminophen (TYLENOL) tablet 650 mg  650 mg Oral Q6H PRN Ethelene Hal, NP   650 mg at 07/04/17 2152  . alum & mag hydroxide-simeth (MAALOX/MYLANTA) 200-200-20 MG/5ML suspension 30 mL  30 mL Oral Q4H PRN Ethelene Hal, NP      . hydrOXYzine (ATARAX/VISTARIL) tablet 25 mg  25 mg Oral Daily Ethelene Hal, NP   25 mg at 07/05/17 0851  . loratadine (CLARITIN) tablet 10 mg  10 mg Oral Daily Ethelene Hal, NP   10 mg at 07/05/17 0851  . magnesium hydroxide (MILK OF MAGNESIA) suspension 30 mL  30 mL Oral Daily PRN Ethelene Hal, NP      . sertraline (ZOLOFT) tablet 50 mg  50 mg Oral Daily Ethelene Hal, NP   50 mg at 07/05/17 0851  . traZODone (DESYREL) tablet 50 mg  50 mg Oral QHS PRN Lyllie Cobbins B, NP        Lab Results:  Results for orders placed or performed during the hospital encounter of 07/03/17 (from the past 48 hour(s))  Comprehensive metabolic panel     Status: None   Collection Time: 07/03/17  5:08 PM  Result Value Ref Range   Sodium 139 135 - 145 mmol/L   Potassium 3.6 3.5 - 5.1 mmol/L   Chloride 106 101 - 111 mmol/L   CO2 23 22 - 32 mmol/L   Glucose, Bld 83 65 - 99 mg/dL   BUN 12 6 - 20 mg/dL   Creatinine, Ser 0.61 0.44 - 1.00  mg/dL   Calcium 9.4 8.9 - 10.3 mg/dL   Total Protein 7.4 6.5 - 8.1 g/dL   Albumin 4.3 3.5 - 5.0 g/dL   AST 18 15 - 41 U/L   ALT 16 14 - 54 U/L   Alkaline Phosphatase 74 38 - 126 U/L   Total Bilirubin 0.6 0.3 - 1.2 mg/dL   GFR calc non Af Amer >60 >60 mL/min   GFR calc Af Amer >60 >60 mL/min    Comment: (NOTE) The eGFR has been calculated using the CKD EPI equation. This calculation has not been validated in all clinical situations. eGFR's persistently <60 mL/min signify possible Chronic Kidney Disease.    Anion gap 10 5 - 15  Ethanol     Status: None   Collection Time: 07/03/17  5:08 PM  Result Value Ref Range   Alcohol, Ethyl (B) <5 <5 mg/dL    Comment:        LOWEST DETECTABLE LIMIT FOR SERUM ALCOHOL IS 5 mg/dL FOR MEDICAL PURPOSES ONLY   Salicylate  level     Status: None   Collection Time: 07/03/17  5:08 PM  Result Value Ref Range   Salicylate Lvl <2.1 2.8 - 30.0 mg/dL  Acetaminophen level     Status: Abnormal   Collection Time: 07/03/17  5:08 PM  Result Value Ref Range   Acetaminophen (Tylenol), Serum <10 (L) 10 - 30 ug/mL    Comment:        THERAPEUTIC CONCENTRATIONS VARY SIGNIFICANTLY. A RANGE OF 10-30 ug/mL MAY BE AN EFFECTIVE CONCENTRATION FOR MANY PATIENTS. HOWEVER, SOME ARE BEST TREATED AT CONCENTRATIONS OUTSIDE THIS RANGE. ACETAMINOPHEN CONCENTRATIONS >150 ug/mL AT 4 HOURS AFTER INGESTION AND >50 ug/mL AT 12 HOURS AFTER INGESTION ARE OFTEN ASSOCIATED WITH TOXIC REACTIONS.   cbc     Status: None   Collection Time: 07/03/17  5:08 PM  Result Value Ref Range   WBC 7.9 4.0 - 10.5 K/uL   RBC 4.72 3.87 - 5.11 MIL/uL   Hemoglobin 13.2 12.0 - 15.0 g/dL   HCT 39.1 36.0 - 46.0 %   MCV 82.8 78.0 - 100.0 fL   MCH 28.0 26.0 - 34.0 pg   MCHC 33.8 30.0 - 36.0 g/dL   RDW 13.8 11.5 - 15.5 %   Platelets 237 150 - 400 K/uL  I-Stat beta hCG blood, ED     Status: None   Collection Time: 07/03/17  5:19 PM  Result Value Ref Range   I-stat hCG, quantitative <5.0 <5 mIU/mL   Comment 3            Comment:   GEST. AGE      CONC.  (mIU/mL)   <=1 WEEK        5 - 50     2 WEEKS       50 - 500     3 WEEKS       100 - 10,000     4 WEEKS     1,000 - 30,000        FEMALE AND NON-PREGNANT FEMALE:     LESS THAN 5 mIU/mL   Rapid urine drug screen (hospital performed)     Status: None   Collection Time: 07/03/17  6:22 PM  Result Value Ref Range   Opiates NONE DETECTED NONE DETECTED   Cocaine NONE DETECTED NONE DETECTED   Benzodiazepines NONE DETECTED NONE DETECTED   Amphetamines NONE DETECTED NONE DETECTED   Tetrahydrocannabinol NONE DETECTED NONE DETECTED   Barbiturates NONE DETECTED  NONE DETECTED    Comment:        DRUG SCREEN FOR MEDICAL PURPOSES ONLY.  IF CONFIRMATION IS NEEDED FOR ANY PURPOSE, NOTIFY LAB WITHIN 5 DAYS.         LOWEST DETECTABLE LIMITS FOR URINE DRUG SCREEN Drug Class       Cutoff (ng/mL) Amphetamine      1000 Barbiturate      200 Benzodiazepine   254 Tricyclics       270 Opiates          300 Cocaine          300 THC              50     Blood Alcohol level:  Lab Results  Component Value Date   ETH <5 62/37/6283    Metabolic Disorder Labs: No results found for: HGBA1C, MPG No results found for: PROLACTIN No results found for: CHOL, TRIG, HDL, CHOLHDL, VLDL, LDLCALC  Physical Findings: AIMS: Facial and Oral Movements Muscles of Facial Expression: None, normal Lips and Perioral Area: None, normal Jaw: None, normal Tongue: None, normal,Extremity Movements Upper (arms, wrists, hands, fingers): None, normal Lower (legs, knees, ankles, toes): None, normal, Trunk Movements Neck, shoulders, hips: None, normal, Overall Severity Severity of abnormal movements (highest score from questions above): None, normal Incapacitation due to abnormal movements: None, normal Patient's awareness of abnormal movements (rate only patient's report): No Awareness, Dental Status Current problems with teeth and/or dentures?: No Does patient usually wear dentures?: No  CIWA:    COWS:     Musculoskeletal: Strength & Muscle Tone: within normal limits Gait & Station: normal Patient leans: N/A  Psychiatric Specialty Exam: Physical Exam  Nursing note and vitals reviewed. Constitutional: She is oriented to person, place, and time.  Neck: Normal range of motion.  Respiratory: Effort normal.  Musculoskeletal: Normal range of motion.  Neurological: She is alert and oriented to person, place, and time.  Psychiatric: Her speech is normal and behavior is normal. Her mood appears anxious. Cognition and memory are normal. She expresses impulsivity. She exhibits a depressed mood. She expresses suicidal ideation. She expresses suicidal plans.    Review of Systems  Psychiatric/Behavioral: Positive for depression  and suicidal ideas. The patient is nervous/anxious and has insomnia.     Blood pressure 102/62, pulse 79, temperature 98.6 F (37 C), temperature source Oral, resp. rate 18, height _0  (1.651 m), weight 77.1 kg (169 lb 15.6 oz).Body mass index is 28.29 kg/m.  General Appearance: Casual  Eye Contact:  Good  Speech:  Clear and Coherent and Normal Rate  Volume:  Normal  Mood:  Depressed  Affect:  Depressed  Thought Process:  Coherent and Goal Directed  Orientation:  Full (Time, Place, and Person)  Thought Content:  Logical  Suicidal Thoughts:  Yes.  with intent/plan  Homicidal Thoughts:  No  Memory:  Immediate;   Good Recent;   Good Remote;   Good  Judgement:  Fair  Insight:  Fair  Psychomotor Activity:  Normal  Concentration:  Concentration: Good and Attention Span: Good  Recall:  Good  Fund of Knowledge:  Fair  Language:  Good  Akathisia:  No  Handed:  Right  AIMS (if indicated):     Assets:  Communication Skills Desire for Improvement Housing Social Support  ADL's:  Intact  Cognition:  WNL  Sleep:        Treatment Plan Summary: Daily contact with patient to assess and evaluate symptoms and progress  in treatment, Medication management and Plan Inpatient psychiatric treatment   Medications:   Zoloft 50 mg daily for Depression Vistaril 25 mg Tid prn for Anxiety Trazodone 50 mg Q hs prn for Insomnia  Kaylanni Ezelle, NP 07/05/2017, 10:25 AM

## 2017-07-05 NOTE — Progress Notes (Signed)
[facility] Group Notes:  (Nursing/MHT/Case Management/Adjunct)  Date:  07/05/2017  Time:  9:54 PM  Type of Therapy:  Psychoeducational Skills  Participation Level:  Active  Participation Quality:  Appropriate  Affect:  Appropriate  Cognitive:  Appropriate  Insight:  Appropriate  Engagement in Group:  Engaged  Modes of Intervention:  Education  Summary of Progress/Problems: She states that she had a better day since she is no longer in the hospital and that she is feeling better. In terms of the theme of the day, her coping skill will be to listen to music.  Hazle CocaGOODMAN, Amos Gaber S 07/05/2017, 9:54 PM

## 2017-07-05 NOTE — Progress Notes (Signed)
Patient complained of headache of 5/10. Accepted PRN of Acetaminophen 650 mg. Denies SI/HI, AH/VH at this time. Endorses mild depression of 4/10. Slept most of the shift. No behavior issues noted.

## 2017-07-05 NOTE — Tx Team (Signed)
Initial Treatment Plan 07/05/2017 3:33 PM Anne ChattersKira N Jokerst ZOX:[id number]    PATIENT STRESSORS: Educational concerns Marital or family conflict   PATIENT STRENGTHS: Wellsite geologistCommunication skills General fund of knowledge Physical Health   PATIENT IDENTIFIED PROBLEMS: Depression  Suicidal ideation  "I want the suicidal thoughts to quiet down"  "Learn some coping skill, since I haven't been taught any"               DISCHARGE CRITERIA:  Improved stabilization in mood, thinking, and/or behavior Verbal commitment to aftercare and medication compliance  PRELIMINARY DISCHARGE PLAN: Outpatient therapy Medication management  PATIENT/FAMILY INVOLVEMENT: This treatment plan has been presented to and reviewed with the patient, Anne ChattersKira N Harbin.  The patient and family have been given the opportunity to ask questions and make suggestions.  Levin BaconHeather V Marliyah Reid, RN 07/05/2017, 3:33 PM

## 2017-07-05 NOTE — Progress Notes (Signed)
Anne Duarte is a 21 year old female being admitted voluntarily to 400-2 from the OBS unit.  She is being admitted for suicidal ideation and plan to OD.  She denies any history of previous attempts in the past.  She denied HI or A/V hallucinations.  She is diagnosed with Major Depressive Disorder and Generalized Anxiety Disorder.  [facility] she stated that her suicidal thoughts come and go.  They are worse when she is in school.  She denies any medical issues and appears to be in no physical distress.  Oriented her to the unit.  Admission paperwork completed and signed.  No belongings needing locked up on admission.  Q 15 minute checks initiated for safety.  We will monitor the progress towards her goals.

## 2017-07-06 DIAGNOSIS — R000 Tachycardia, unspecified: Secondary | ICD-10-CM

## 2017-07-06 DIAGNOSIS — R0602 Shortness of breath: Secondary | ICD-10-CM

## 2017-07-06 DIAGNOSIS — F39 Unspecified mood [affective] disorder: Secondary | ICD-10-CM

## 2017-07-06 DIAGNOSIS — R45851 Suicidal ideations: Secondary | ICD-10-CM

## 2017-07-06 MED ORDER — HYDROXYZINE HCL 25 MG PO TABS: 25.0000 mg | ORAL_TABLET | Freq: Three times a day (TID) | ORAL | Status: DC | PRN

## 2017-07-06 MED ORDER — SERTRALINE HCL 25 MG PO TABS
75.0000 mg | ORAL_TABLET | Freq: Every day | ORAL | Status: DC
  Administered 2017-07-07 – 2017-07-09 (×3): 75 mg via ORAL
  Filled 2017-07-06 (×5): qty 3

## 2017-07-06 MED ORDER — QUETIAPINE FUMARATE 50 MG PO TABS
50.0000 mg | ORAL_TABLET | Freq: Every day | ORAL | Status: DC
  Administered 2017-07-06 – 2017-07-08 (×3): 50 mg via ORAL
  Filled 2017-07-06 (×6): qty 1

## 2017-07-06 NOTE — [facility] Group Notes (Signed)
  Date:  07/06/2017  Time:  1100  Type of Therapy:  Nurse Education  Participation Level:  Minimal  Participation Quality:  Attentive  Affect:  Anxious  Cognitive:  Alert  Insight:  Improving  Engagement in Group:  Engaged  Modes of Intervention:  Education  Summary of Progress/Problems:  Rich BraveDuke, Defne Gerling Lynn 07/06/2017, 3:01 PM

## 2017-07-06 NOTE — Progress Notes (Signed)
[facility] Group Notes:  (Nursing/MHT/Case Management/Adjunct)  Date:  07/06/2017  Time:  10:18 PM  Type of Therapy:  Psychoeducational Skills  Participation Level:  Active  Participation Quality:  Appropriate  Affect:  Appropriate  Cognitive:  Appropriate  Insight:  Appropriate  Engagement in Group:  Engaged  Modes of Intervention:  Education  Summary of Progress/Problems: Patient states that she had a good day overall and was able to write in her journal. Her mood was improved as well. As for the theme of the day, her support system will be comprised of her counselor in college.   Hazle CocaGOODMAN, Ashten Prats S 07/06/2017, 10:18 PM
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
## 2017-07-06 NOTE — Progress Notes (Signed)
D Patient is seen shyly approaching the nurses' station this morning- when this writer requested she come there to receive her morning medications. She appears anxious. She does not make good eye contact  _-she  stands more than 12 inches away from this writer-during med pass. She is non committal- she shows no emotion and briskly takes her scheduled medications as requested. A She did complete her daily assessment and on this she wrote denied having SI today and she rated her depression, hopelessness and anxiety " 5/7/5", respectively.She is currently attending her SW group. R Safety is in place and therapeutic relationship is fostered.

## 2017-07-06 NOTE — BHH Suicide Risk Assessment (Signed)
[facility] Admission Suicide Risk Assessment   Nursing information obtained from:  Patient Demographic factors:  Adolescent or young adult, Caucasian, Gay, lesbian, or bisexual orientation Current Mental Status:  Suicide plan Loss Factors:  NA Historical Factors:  Family history of mental illness or substance abuse Risk Reduction Factors:  Sense of responsibility to family, Living with another person, especially a relative  Total Time spent with patient: 45 minutes Principal Problem: MDD (major depressive disorder), recurrent severe, without psychosis (HCC) Diagnosis:   Patient Active Problem List   Diagnosis Date Noted  . MDD (major depressive disorder), recurrent severe, without psychosis (HCC) [F33.2] 07/04/2017  . 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  Patient endorse increased stress at school.  She admitted to feeling hopelessness helplessness and worthlessness. She is a Consulting civil engineer at [employer] and lately feeling very anxious and not going to her classes.  She's been seeing Ardeen Jourdain for psychiatric medication and taking Zoloft 50 mg but does not feel it is helping as much.  She also endorses bad dreams.  Patient requires inpatient treatment.  Please see history and physical for more detailed information.  Continued Clinical Symptoms:  Alcohol Use Disorder Identification Test Final Score (AUDIT): 0 The "Alcohol Use Disorders Identification Test", Guidelines for Use in Primary Care, Second Edition.  World Science writer [facility]). Score between 0-7:  no or low risk or alcohol related problems. Score between 8-15:  moderate risk of alcohol related problems. Score between 16-19:  high risk of alcohol related problems. Score 20 or above:  warrants  further diagnostic evaluation for alcohol dependence and treatment.   CLINICAL FACTORS:   Severe Anxiety and/or Agitation Depression:   Anhedonia Hopelessness Insomnia   Musculoskeletal: Strength & Muscle Tone: within normal limits Gait & Station: normal Patient leans: N/A  Psychiatric Specialty Exam: Physical Exam  ROS  Blood pressure 100/66, pulse 91, temperature 98.7 F (37.1 C), resp. rate 18, height  (1.651 m), weight 77.1 kg (169 lb 15.6 oz).Body mass index is 28.29 kg/m.  General Appearance: Casual  Eye Contact:  Fair  Speech:  Slow  Volume:  Normal  Mood:  Anxious, Depressed and Dysphoric  Affect:  Constricted and Depressed  Thought Process:  Goal Directed  Orientation:  Full (Time, Place, and Person)  Thought Content:  Rumination  Suicidal Thoughts:  Yes.  with intent/plan  Homicidal Thoughts:  No  Memory:  Immediate;   Good Recent;   Fair Remote;   Fair  Judgement:  Fair  Insight:  Good  Psychomotor Activity:  Normal  Concentration:  Concentration: Fair and Attention Span: Fair  Recall:  [employer] of Knowledge:  Fair  Language:  Good  Akathisia:  No  Handed:  Right  AIMS (if indicated):     Assets:  Communication Skills Desire for Improvement Housing  ADL's:  Intact  Cognition:  WNL  Sleep:  Number of Hours: 6.25      COGNITIVE FEATURES THAT CONTRIBUTE TO RISK:  Closed-mindedness and Loss of executive function    SUICIDE RISK:   Moderate:  Frequent suicidal ideation with limited intensity, and duration, some specificity in terms of plans, no associated intent, good self-control, limited dysphoria/symptomatology, some risk factors present, and identifiable protective factors, including available and accessible social support.  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  Patient requires inpatient treatment.  We will  increase Zoloft 75 mg daily.  We will review blood work results and increase collateral information.  Please see history and physical for more details care plan.  I certify that inpatient services furnished can reasonably be expected to improve the patient's condition.   Jaevin Medearis T., MD 07/06/2017, 11:54 AM

## 2017-07-06 NOTE — [facility] Group Notes (Signed)
[facility]  Date/Time:    07/06/2017 10:00-11:00AM  Type of Therapy and Topic:  Group Therapy:  Healthy vs Unhealthy Coping Skills  Participation Level:  Active   Description of Group:  The focus of this group was to determine what unhealthy coping techniques typically are used by group members and what healthy coping techniques would be helpful in coping with various problems. Patients were guided in becoming aware of the differences between healthy and unhealthy coping techniques.  "Benefits" and "Costs" of a number of coping skills were evaluated by the group, including isolation, cutting, drinking/using drugs, exercising, talking things out, and taking out anger on a pillow.    Therapeutic Goals 1. Patients learned that coping is what human beings do all day long to deal with various situations in their lives 2. Patients defined and discussed healthy vs unhealthy coping techniques 3. Patients identified their preferred coping techniques and identified whether these were healthy or unhealthy 4. Patients provided support and ideas to each other  Summary of Patient Progress: During group, patient expressed a full understanding of the topic and a willingness to share her own unhealthy coping technique of isolation.   Therapeutic Modalities Cognitive Behavioral Therapy Motivational Interviewing   Ambrose MantleMareida Grossman-Orr, LCSW 07/06/2017, 3:40 PM

## 2017-07-06 NOTE — H&P (Signed)
Psychiatric Admission Assessment Adult  Patient Identification: Anne ChattersKira N Duarte MRN:  [id number] Date of Evaluation:  07/06/2017 Chief Complaint:  MDD Principal Diagnosis: MDD (major depressive disorder), recurrent severe, without psychosis (HCC) Diagnosis:   Patient Active Problem List   Diagnosis Date Noted  . MDD (major depressive disorder), recurrent severe, without psychosis (HCC) [F33.2] 07/04/2017  . MDD (major depressive disorder), single episode, severe , no psychosis (HCC) [F32.2] 07/04/2017   History of Present Illness:  Anne RiddleKira is a 21 year old female being admitted voluntarily to [facility]2 from the OBS unit.  She is being admitted for suicidal ideation and plan to OD.  She denies any history of previous attempts in the past.  She denied HI or A/V hallucinations.  She is diagnosed with Major Depressive Disorder and Generalized Anxiety Disorder.  [name], she stated that her suicidal thoughts come and go.  They are worse when she is in school.  She denies any medical issues and appears to be in no physical distress.  Patient states that she has a lot of issues leaving her apartment due to her paranoia. She states that she has friend that usually is able to go with her to her classes but hasn't been able to this semester. She denies any SI/HI/AVH currently, but still depressed. She reports that the Zoloft was started earlier this year , but hasn't seen a significant improvement. She reports having bad dreams about being sexually assaulted or being abducted, but this has never happened to her in the past. She tried Prazosin in the past and she had elevated heart rate and SOB. She agreed to continue Zoloft and will add Seroquel to improve her sleep and depression.   Associated Signs/Symptoms: Depression Symptoms:  depressed mood, hypersomnia, fatigue, hopelessness, anxiety, panic attacks, (Hypo) Manic Symptoms:  Denies Anxiety Symptoms:  Excessive Worry, Panic Symptoms, Social  Anxiety, Psychotic Symptoms:  Paranoia, PTSD Symptoms: NA Total Time spent with patient: 45 minutes  Past Psychiatric History: GAD, MDD, SI  Is the patient at risk to self? Yes.    Has the patient been a risk to self in the past 6 months? No.  Has the patient been a risk to self within the distant past? No.  Is the patient a risk to others? No.  Has the patient been a risk to others in the past 6 months? No.  Has the patient been a risk to others within the distant past? No.   Prior Inpatient Therapy:   Prior Outpatient Therapy:    Alcohol Screening: 1. How often do you have a drink containing alcohol?: Never 9. Have you or someone else been injured as a result of your drinking?: No 10. Has a relative or friend or a doctor or another health worker been concerned about your drinking or suggested you cut down?: No Alcohol Use Disorder Identification Test Final Score (AUDIT): 0 Brief Intervention: AUDIT score less than 7 or less-screening does not suggest unhealthy drinking-brief intervention not indicated Substance Abuse History in the last 12 months:  No. Consequences of Substance Abuse: NA Previous Psychotropic Medications: Yes Prozac, Prazosin Psychological Evaluations: Yes  Past Medical History:  Past Medical History:  Diagnosis Date  . Anxiety     Past Surgical History:  Procedure Laterality Date  . TYMPANOSTOMY TUBE PLACEMENT    . WISDOM TOOTH EXTRACTION     Family History: History reviewed. No pertinent family history. Family Psychiatric  History: Denies Tobacco Screening: Have you used any form of tobacco in the  last 30 days? (Cigarettes, Smokeless Tobacco, Cigars, and/or Pipes): No Social History:  History  Alcohol Use No     History  Drug Use No    Additional Social History:      Pain Medications: see MAR Prescriptions: see MAR Over the Counter: see MAR History of alcohol / drug use?: No history of alcohol / drug abuse                     Allergies:   Allergies  Allergen Reactions  . Prazosin Hcl Other (See Comments)    Rapid heart beat/out of breath   Lab Results: No results found for this or any previous visit (from the past 48 hour(s)).  Blood Alcohol level:  Lab Results  Component Value Date   ETH <5 07/03/2017    Metabolic Disorder Labs:  No results found for: HGBA1C, MPG No results found for: PROLACTIN No results found for: CHOL, TRIG, HDL, CHOLHDL, VLDL, LDLCALC  Current Medications: Current Facility-Administered Medications  Medication Dose Route Frequency Provider Last Rate Last Dose  . acetaminophen (TYLENOL) tablet 650 mg  650 mg Oral Q6H PRN Laveda Abbe, NP   650 mg at 07/04/17 2152  . alum & mag hydroxide-simeth (MAALOX/MYLANTA) 200-200-20 MG/5ML suspension 30 mL  30 mL Oral Q4H PRN Laveda Abbe, NP      . hydrOXYzine (ATARAX/VISTARIL) tablet 25 mg  25 mg Oral Daily Laveda Abbe, NP   25 mg at 07/06/17 1027  . loratadine (CLARITIN) tablet 10 mg  10 mg Oral Daily Laveda Abbe, NP   10 mg at 07/06/17 2536  . magnesium hydroxide (MILK OF MAGNESIA) suspension 30 mL  30 mL Oral Daily PRN Laveda Abbe, NP      . QUEtiapine (SEROQUEL) tablet 50 mg  50 mg Oral QHS Jerald Villalona, Gerlene Burdock, FNP      . sertraline (ZOLOFT) tablet 50 mg  50 mg Oral Daily Laveda Abbe, NP   50 mg at 07/06/17 6440  . traZODone (DESYREL) tablet 50 mg  50 mg Oral QHS PRN Rankin, Shuvon B, NP       PTA Medications: Prescriptions Prior to Admission  Medication Sig Dispense Refill Last Dose  . acetaminophen (TYLENOL) 500 MG tablet Take 1,000 mg by mouth every 6 (six) hours as needed for moderate pain.   Past Week at Unknown time  . cetirizine (ZYRTEC) 10 MG tablet Take 10 mg by mouth daily.   07/02/2017 at Unknown time  . diphenhydrAMINE (BENADRYL) 25 mg capsule Take 25 mg by mouth every 6 (six) hours as needed (migraines).   Past Month at Unknown time  . hydrOXYzine (ATARAX/VISTARIL) 25  MG tablet Take 1 tablet by mouth daily.   more than 30 days  . pseudoephedrine (SUDAFED) 30 MG tablet Take 30 mg by mouth daily as needed for congestion.   07/02/2017 at Unknown time  . sertraline (ZOLOFT) 50 MG tablet Take 50 mg by mouth daily.  2 07/03/2017 at Unknown time    Musculoskeletal: Strength & Muscle Tone: within normal limits Gait & Station: normal Patient leans: N/A  Psychiatric Specialty Exam: Physical Exam  Nursing note and vitals reviewed. Constitutional: She is oriented to person, place, and time. She appears well-developed and well-nourished.  Cardiovascular: Normal rate.   Respiratory: Effort normal.  Musculoskeletal: Normal range of motion.  Neurological: She is alert and oriented to person, place, and time.  Skin: Skin is warm.    Review of Systems  Constitutional: Negative.   HENT: Negative.   Eyes: Negative.   Respiratory: Negative.   Cardiovascular: Negative.   Gastrointestinal: Negative.   Genitourinary: Negative.   Musculoskeletal: Negative.   Skin: Negative.   Neurological: Negative.   Endo/Heme/Allergies: Negative.     Blood pressure 100/66, pulse 91, temperature 98.7 F (37.1 C), resp. rate 18, height  (1.651 m), weight 77.1 kg (169 lb 15.6 oz).Body mass index is 28.29 kg/m.  General Appearance: Casual  Eye Contact:  Good  Speech:  Clear and Coherent and Normal Rate  Volume:  Normal  Mood:  Depressed  Affect:  Depressed and Flat  Thought Process:  Coherent and Descriptions of Associations: Intact  Orientation:  Full (Time, Place, and Person)  Thought Content:  WDL  Suicidal Thoughts:  No  Homicidal Thoughts:  No  Memory:  Immediate;   Good Recent;   Good  Judgement:  Fair  Insight:  Good  Psychomotor Activity:  Normal  Concentration:  Concentration: Good and Attention Span: Good  Recall:  Good  Fund of Knowledge:  Good  Language:  Good  Akathisia:  No  Handed:  Right  AIMS (if indicated):     Assets:  Scientist, research (physical sciences)  ADL's:  Intact  Cognition:  WNL  Sleep:  Number of Hours: 6.25    Treatment Plan Summary: Daily contact with patient to assess and evaluate symptoms and progress in treatment, Medication management and Plan is to:  -Continue Zoloft 50 mg PO Daily for mood control -Start Seroquel 50 mg PO QHS for mood stability -Continue Vistaril 25 mg TID PRN for anxiety -Continue Trazodone 50 mg PO QHS PRN for insomnia -Encourage group therapy participation  Observation Level/Precautions:  15 minute checks  Laboratory:  Reviewed  Psychotherapy:  Group Therapy  Medications:  See MAR  Consultations:  As needed  Discharge Concerns:  Compliance  Estimated LOS: 3-5 Days  Other:  Admit to [facility] [address]   Physician Treatment Plan for Primary Diagnosis: MDD (major depressive disorder), recurrent severe, without psychosis (HCC) Long Term Goal(s): Improvement in symptoms so as ready for discharge  Short Term Goals: Ability to verbalize feelings will improve and Ability to disclose and discuss suicidal ideas  Physician Treatment Plan for Secondary Diagnosis: Principal Problem:   MDD (major depressive disorder), recurrent severe, without psychosis (HCC) Active Problems:   MDD (major depressive disorder), single episode, severe , no psychosis (HCC)  Long Term Goal(s): Improvement in symptoms so as ready for discharge  Short Term Goals: Ability to maintain clinical measurements within normal limits will improve and Compliance with prescribed medications will improve  I certify that inpatient services furnished can reasonably be expected to improve the patient's condition.    Maryfrances Bunnell, FNP 9/8/201811:27 AM

## 2017-07-06 NOTE — Progress Notes (Signed)
D   Pt is pleasant on approach and cooperative   She interacts well with her peers and is compliant with treatment   She reports improved mood and less depression  Is making hopeful statements  A   Verbal support given   Medications administered and effectiveness monitored   Q 15 min checks  R   Pt is safe and receptive to verbal support

## 2017-07-07 NOTE — Progress Notes (Signed)
D Patient is observed this morning- after shift change  She comes to this Clinical research associatewriter and requests her meds. She is pleasant, cooeprative and she is up-beat today. She says " I dlept last night!". A She completed her daily assessment and on this she wrote she denied SI today and she rated her depression, hopelessness and anxiety " 4/5/5", respectively. R Safety is in place.

## 2017-07-07 NOTE — Progress Notes (Signed)
   Date:  07/07/2017 Time:  1100  Group Topic/Focus:Anger as a 2nd Emotion  Emotional Education:   The group is focused on teaching patients how to identify their primary feeling that precedes their anger as well as how to develop healthy coping skills to navigates their anger.  Participation Level:  Active  Participation Quality:  Attentive  Affect:  Appropriate  Cognitive:  Alert  Insight: Improving  Engagement in Group:  Engaged  Modes of Intervention:  Education  Additional Comments:    Rich BraveDuke, Shella Lahman Lynn 07/07/2017, 6:01 PM

## 2017-07-07 NOTE — Progress Notes (Signed)
[facility] Group Notes:  (Nursing/MHT/Case Management/Adjunct)  Date:  07/07/2017  Time:  2045  Type of Therapy:  wrap up group  Participation Level:  Active  Participation Quality:  Appropriate, Attentive, Sharing and Supportive  Affect:  Appropriate  Cognitive:  Appropriate  Insight:  Improving  Engagement in Group:  Engaged  Modes of Intervention:  Clarification, Education and Support  Summary of Progress/Problems: Pt shared that she and her parents were hoping that she would be able to be discharged soon because she was feeling better. Pt reported discharging back to campus where she lives with roommates but will have the place to herself for a few days in order to transition back into real world. Pt said her best friend since kindergarten is her emotional support but she is going to school in [place] and they don't talk as often. Pt would like a service dog as a physical support reporting it being hard for her to walk places alone because pt feels scared and paranoid. Pt shared she is grateful for her best friend.   Anne Duarte, Anne Duarte 07/07/2017, 10:15 PM

## 2017-07-07 NOTE — Progress Notes (Signed)
[facility] MD Progress Note  07/07/2017 10:31 AM Anne Duarte  MRN:  [id number]   Subjective:  [name] states that she slept well last night and is still a little drowsy today. She denies any SI/HI/AVH and contracts for safety. She is focused on getting discharged and states that she wants to get back to school.   Objective: Patient is pleasant and cooperative. She has a pleasant mood but flat affect. Educated her on ensuring medication is appropriate and that she needed to be ready to cope with college upon discharge and that she had a significant complaint about paranoia at college yesterday. Today she has no complaint of paranoia at college. She stated understanding and will continue medications at the current doses.   Principal Problem: MDD (major depressive disorder), recurrent severe, without psychosis (HCC) Diagnosis:   Patient Active Problem List   Diagnosis Date Noted  . MDD (major depressive disorder), recurrent severe, without psychosis (HCC) [F33.2] 07/04/2017  . MDD (major depressive disorder), single episode, severe , no psychosis (HCC) [F32.2] 07/04/2017   Total Time spent with patient: 15 minutes  Past Psychiatric History: See H&P  Past Medical History:  Past Medical History:  Diagnosis Date  . Anxiety     Past Surgical History:  Procedure Laterality Date  . TYMPANOSTOMY TUBE PLACEMENT    . WISDOM TOOTH EXTRACTION     Family History: History reviewed. No pertinent family history. Family Psychiatric  History: See H&P Social History:  History  Alcohol Use No     History  Drug Use No    Social History   Social History  . Marital status: Single    Spouse name: N/A  . Number of children: N/A  . Years of education: N/A   Social History Main Topics  . Smoking status: Never Smoker  . Smokeless tobacco: Never Used  . Alcohol use No  . Drug use: No  . Sexual activity: No   Other Topics Concern  . None   Social History Narrative  . None   Additional Social  History:    Pain Medications: see MAR Prescriptions: see MAR Over the Counter: see MAR History of alcohol / drug use?: No history of alcohol / drug abuse                    Sleep: Good  Appetite:  Good  Current Medications: Current Facility-Administered Medications  Medication Dose Route Frequency Provider Last Rate Last Dose  . acetaminophen (TYLENOL) tablet 650 mg  650 mg Oral Q6H PRN Laveda Abbe, NP   650 mg at 07/06/17 2129  . alum & mag hydroxide-simeth (MAALOX/MYLANTA) 200-200-20 MG/5ML suspension 30 mL  30 mL Oral Q4H PRN Laveda Abbe, NP      . hydrOXYzine (ATARAX/VISTARIL) tablet 25 mg  25 mg Oral TID PRN Money, Gerlene Burdock, FNP      . loratadine (CLARITIN) tablet 10 mg  10 mg Oral Daily Laveda Abbe, NP   10 mg at 07/07/17 0856  . magnesium hydroxide (MILK OF MAGNESIA) suspension 30 mL  30 mL Oral Daily PRN Laveda Abbe, NP      . QUEtiapine (SEROQUEL) tablet 50 mg  50 mg Oral QHS Money, Gerlene Burdock, FNP   50 mg at 07/06/17 2129  . sertraline (ZOLOFT) tablet 75 mg  75 mg Oral Daily Arfeen, Phillips Grout, MD   75 mg at 07/07/17 0856  . traZODone (DESYREL) tablet 50 mg  50 mg Oral QHS PRN Rankin,  Shuvon B, NP        Lab Results: No results found for this or any previous visit (from the past 48 hour(s)).  Blood Alcohol level:  Lab Results  Component Value Date   ETH <5 07/03/2017    Metabolic Disorder Labs: No results found for: HGBA1C, MPG No results found for: PROLACTIN No results found for: CHOL, TRIG, HDL, CHOLHDL, VLDL, LDLCALC  Physical Findings: AIMS: Facial and Oral Movements Muscles of Facial Expression: None, normal Lips and Perioral Area: None, normal Jaw: None, normal Tongue: None, normal,Extremity Movements Upper (arms, wrists, hands, fingers): None, normal Lower (legs, knees, ankles, toes): None, normal, Trunk Movements Neck, shoulders, hips: None, normal, Overall Severity Severity of abnormal movements (highest score  from questions above): None, normal Incapacitation due to abnormal movements: None, normal Patient's awareness of abnormal movements (rate only patient's report): No Awareness, Dental Status Current problems with teeth and/or dentures?: No Does patient usually wear dentures?: No  CIWA:    COWS:     Musculoskeletal: Strength & Muscle Tone: within normal limits Gait & Station: normal Patient leans: N/A  Psychiatric Specialty Exam: Physical Exam  Nursing note and vitals reviewed. Constitutional: She is oriented to person, place, and time. She appears well-developed and well-nourished.  Cardiovascular: Normal rate.   Respiratory: Effort normal.  Musculoskeletal: Normal range of motion.  Neurological: She is alert and oriented to person, place, and time.  Skin: Skin is warm.    Review of Systems  Constitutional: Negative.   HENT: Negative.   Eyes: Negative.   Respiratory: Negative.   Cardiovascular: Negative.   Gastrointestinal: Negative.   Genitourinary: Negative.   Musculoskeletal: Negative.   Skin: Negative.   Neurological: Negative.   Endo/Heme/Allergies: Negative.     Blood pressure 103/68, pulse 100, temperature 98 F (36.7 C), resp. rate 16, height 5\' 5"  (1.651 m), weight 77.1 kg (169 lb 15.6 oz).Body mass index is 28.29 kg/m.  General Appearance: Casual  Eye Contact:  Good  Speech:  Clear and Coherent and Normal Rate  Volume:  Normal  Mood:  Euthymic  Affect:  Flat  Thought Process:  Coherent and Descriptions of Associations: Intact  Orientation:  Full (Time, Place, and Person)  Thought Content:  WDL  Suicidal Thoughts:  No  Homicidal Thoughts:  No  Memory:  Immediate;   Good Recent;   Good Remote;   Good  Judgement:  Good  Insight:  Fair  Psychomotor Activity:  Normal  Concentration:  Concentration: Good and Attention Span: Good  Recall:  Good  Fund of Knowledge:  Good  Language:  Good  Akathisia:  No  Handed:  Right  AIMS (if indicated):     Assets:   Communication Skills Desire for Improvement Financial Resources/Insurance Housing Physical Health Social Support Transportation Vocational/Educational  ADL's:  Intact  Cognition:  WNL  Sleep:  Number of Hours: 6.25     Treatment Plan Summary: Daily contact with patient to assess and evaluate symptoms and progress in treatment, Medication management and Plan is to:  -Continue Sertraline 75 mg PO Daily for mood stability -Continue Seroquel 50 mg PO QHS for mood stability -Continue Trazodone 50 mg PO QHS PRN for insomnia -Continue Vistaril 25 mg PO TID PRN for anxiety -Encourage group therapy participation  Maryfrances Bunnellravis B Money, FNP 07/07/2017, 10:31 AM

## 2017-07-07 NOTE — [facility] Group Notes (Signed)
[facility] LCSW Group Therapy Note  Date/Time:    07/07/2017   10:00 - 11:00 AM  Type of Therapy and Topic:  Group Therapy:  Healthy Self Image and Positive Change  Participation Level:  Active   Description of Group:  In this group, patients compared and contrasted their current "I am...." statements to the visions they identified as desirable for their lives.  Patients discussed their tendency toward cognitive distortions, and how they can go about making positive changes in their cognitions that will positively impact their behaviors.  Many expressions of similarities and mutual support were provided among group members.  Facilitator played a motivational 3-minute speech and a discussion was held regarding reactions.  Patients were left with the task of thinking about what "I am...." statements they can start using in their lives immediately.  Therapeutic Goals: 1. Patient will state their current self-perception as expressed in an "I Am" statement 2. Patient will contrast this with their desired vision for their lives 3. Patient will discuss cognitive distortions and how these affect their ongoing "I Am" thoughts 4. Patient will verbalize statements that challenge their cognitive distortions  Summary of Patient Progress:  The patient expressed initially "I am an artist", then by the end of group was able to state "I am a fighter" and explain why she was able to make this change.   Therapeutic Modalities Cognitive Behavioral Therapy Motivational Interviewing  Ambrose MantleMareida Grossman-Orr, LCSW 07/07/2017 2:00 PM

## 2017-07-08 NOTE — Progress Notes (Signed)
[facility] MD Progress Note  07/08/2017 2:16 PM Anne Duarte  MRN:  [id number]   Subjective:  Patient reports she is feeling better. She denies any ongoing suicidal ideations . States she feels less depressed, but " kind of anxious". Denies medication side effects. States she has had good visits from her parents, and feels supported .   Objective: I have discussed case with treatment team and have met with patient . 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 Presented for depression, anxiety, suicidal ideations. No psychotic symptoms. Reports history of depression, intermittently since adolescence. Describes symptoms of Generalized Anxiety, and states she worries excessively about even relatively minor issues .  She reports she has been on Zoloft for several months , and it was recently titrated up to 75 mgrs daily. Seroquel was added 1-2 days ago. Denies medication side effects.  Behavior on unit in good control, has been going to groups, and reports she feels she is starting to be more sociable .    Principal Problem: MDD (major depressive disorder), recurrent severe, without psychosis ([place]) Diagnosis:   Patient Active Problem List   Diagnosis Date Noted  . MDD (major depressive disorder), recurrent severe, without psychosis ([place]) [F33.2] 07/04/2017  . MDD (major depressive disorder), single episode, severe , no psychosis ([place]) [F32.2] 07/04/2017   Total Time spent with patient: 20 minutes  Past Psychiatric History: See H&P  Past Medical History:  Past Medical History:  Diagnosis Date  . Anxiety     Past Surgical History:  Procedure Laterality Date  . TYMPANOSTOMY TUBE PLACEMENT    . WISDOM TOOTH EXTRACTION     Family History: History reviewed. No pertinent family history. Family Psychiatric  History: See H&P Social History:  History  Alcohol Use No     History  Drug Use No    Social History   Social History  . Marital status: Single    Spouse name: N/A  . Number of  children: N/A  . Years of education: N/A   Social History Main Topics  . Smoking status: Never Smoker  . Smokeless tobacco: Never Used  . Alcohol use No  . Drug use: No  . Sexual activity: No   Other Topics Concern  . None   Social History Narrative  . None   Additional Social History:    Pain Medications: see MAR Prescriptions: see MAR Over the Counter: see MAR History of alcohol / drug use?: No history of alcohol / drug abuse  Sleep: Good  Appetite:  Good  Current Medications: Current Facility-Administered Medications  Medication Dose Route Frequency Provider Last Rate Last Dose  . acetaminophen (TYLENOL) tablet 650 mg  650 mg Oral Q6H PRN Ethelene Hal, NP   650 mg at 07/06/17 2129  . alum & mag hydroxide-simeth (MAALOX/MYLANTA) 200-200-20 MG/5ML suspension 30 mL  30 mL Oral Q4H PRN Ethelene Hal, NP      . hydrOXYzine (ATARAX/VISTARIL) tablet 25 mg  25 mg Oral TID PRN Money, Lowry Ram, FNP      . loratadine (CLARITIN) tablet 10 mg  10 mg Oral Daily Ethelene Hal, NP   10 mg at 07/08/17 0800  . magnesium hydroxide (MILK OF MAGNESIA) suspension 30 mL  30 mL Oral Daily PRN Ethelene Hal, NP      . QUEtiapine (SEROQUEL) tablet 50 mg  50 mg Oral QHS Money, Lowry Ram, FNP   50 mg at 07/07/17 2208  . sertraline (ZOLOFT) tablet 75 mg  75  mg Oral Daily Arfeen, Arlyce Harman, MD   75 mg at 07/08/17 0800  . traZODone (DESYREL) tablet 50 mg  50 mg Oral QHS PRN Rankin, Shuvon B, NP        Lab Results: No results found for this or any previous visit (from the past 48 hour(s)).  Blood Alcohol level:  Lab Results  Component Value Date   ETH <5 37/07/6268    Metabolic Disorder Labs: No results found for: HGBA1C, MPG No results found for: PROLACTIN No results found for: CHOL, TRIG, HDL, CHOLHDL, VLDL, LDLCALC  Physical Findings: AIMS: Facial and Oral Movements Muscles of Facial Expression: None, normal Lips and Perioral Area: None, normal Jaw: None,  normal Tongue: None, normal,Extremity Movements Upper (arms, wrists, hands, fingers): None, normal Lower (legs, knees, ankles, toes): None, normal, Trunk Movements Neck, shoulders, hips: None, normal, Overall Severity Severity of abnormal movements (highest score from questions above): None, normal Incapacitation due to abnormal movements: None, normal Patient's awareness of abnormal movements (rate only patient's report): No Awareness, Dental Status Current problems with teeth and/or dentures?: No Does patient usually wear dentures?: No  CIWA:    COWS:     Musculoskeletal: Strength & Muscle Tone: within normal limits Gait & Station: normal Patient leans: N/A  Psychiatric Specialty Exam: Physical Exam  Nursing note and vitals reviewed. Constitutional: She is oriented to person, place, and time. She appears well-developed and well-nourished.  Cardiovascular: Normal rate.   Respiratory: Effort normal.  Musculoskeletal: Normal range of motion.  Neurological: She is alert and oriented to person, place, and time.  Skin: Skin is warm.    Review of Systems  Constitutional: Negative.   HENT: Negative.   Eyes: Negative.   Respiratory: Negative.   Cardiovascular: Negative.   Gastrointestinal: Negative.   Genitourinary: Negative.   Musculoskeletal: Negative.   Skin: Negative.   Neurological: Negative.   Endo/Heme/Allergies: Negative.   describes sinus headaches associated with seasonal allergies, denies chest pain, no shortness of breath, no vomiting   Blood pressure (!) 98/59, pulse (!) 107, temperature 98.6 F (37 C), temperature source Oral, resp. rate 16, height _0  (1.651 m), weight 77.1 kg (169 lb 15.6 oz).Body mass index is 28.29 kg/m.  General Appearance: Well Groomed  Eye Contact:  Good  Speech:  Normal Rate  Volume:  Normal  Mood:  reports her mood is improving , describes as 8/10  Affect:  reactive   Thought Process:  Linear and Descriptions of Associations: Intact   Orientation:  Full (Time, Place, and Person)  Thought Content:  no hallucinations, no delusions, not internally preoccupied   Suicidal Thoughts:  No denies suicidal ideations, denies self injurious ideations, no homicidal or violent ideations  Homicidal Thoughts:  No  Memory:  Immediate;   Good Recent;   Good Remote;   Good  Judgement:  Other:  improving   Insight:  improving   Psychomotor Activity:  Normal  Concentration:  Concentration: Good and Attention Span: Good  Recall:  Good  Fund of Knowledge:  Good  Language:  Good  Akathisia:  No  Handed:  Right  AIMS (if indicated):     Assets:  Communication Skills Desire for Improvement Financial Resources/Insurance Housing Physical Health Social Support Transportation Vocational/Educational  ADL's:  Intact  Cognition:  WNL  Sleep:  Number of Hours: 5.75   Assessment- 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 Currently significantly improved, feeling less depressed, more future oriented, and states that  suicidal ideations have resolved. Currently on Zoloft  , recently titrated, and on Seroquel, which she states she takes for insomnia. Side effects, including risk of movement disorders, weight gain, metabolic disturbances on Seroquel discussed .  Treatment Plan Summary: Daily contact with patient to assess and evaluate symptoms and progress in treatment, Medication management and Plan is to:   Treatment plan reviewed today 9/10 as below Encourage group and milieu participation to work on coping skills and symptom reduction Treatment team working on disposition planning options . -Continue Sertraline 75 mg PO Daily for mood stability -Continue Seroquel 50 mg PO QHS for mood stability -Continue Trazodone 50 mg PO QHS PRN for insomnia -Continue Vistaril 25 mg PO TID PRN for anxiety   Jenne Campus, MD 07/08/2017, 2:16 PM  ] Patient ID: Eveline Keto, female    DOB: [birth year], 21 y.o.   MRN: [id number]

## 2017-07-08 NOTE — [facility] Group Notes (Signed)
LCSW Group Therapy Note   07/08/2017 1:15pm   Type of Therapy and Topic:  Group Therapy:  Overcoming Obstacles   Participation Level:  Active   Description of Group:    In this group patients will be encouraged to explore what they see as obstacles to their own wellness and recovery. They will be guided to discuss their thoughts, feelings, and behaviors related to these obstacles. The group will process together ways to cope with barriers, with attention given to specific choices patients can make. Each patient will be challenged to identify changes they are motivated to make in order to overcome their obstacles. This group will be process-oriented, with patients participating in exploration of their own experiences as well as giving and receiving support and challenge from other group members.   Therapeutic Goals: 1. Patient will identify personal and current obstacles as they relate to admission. 2. Patient will identify barriers that currently interfere with their wellness or overcoming obstacles.  3. Patient will identify feelings, thought process and behaviors related to these barriers. 4. Patient will identify two changes they are willing to make to overcome these obstacles:      Summary of Patient Progress   Anne Duarte was attentive and engaged during today's processing group. She shared that her biggest obstacle involves having no energy to go to class due to paranoia and depression. "I don't like to go by myself--I don't feel safe even though I go to a pretty safe school. Anne Duarte continues to show progress in the group setting through her ability to process her obstacles with the group and with developing insight.    Therapeutic Modalities:   Cognitive Behavioral Therapy Solution Focused Therapy Motivational Interviewing Relapse Prevention Therapy  Ledell PeoplesHeather N Smart, LCSW 07/08/2017 1:56 PM

## 2017-07-08 NOTE — Progress Notes (Signed)
D: Patient observed up and visible on unit. Cautious in approach, interactions. Frequent contacts made 1:1 throughout morning. Patient's affect anxious and blunted with congruent mood. Per self inventory and discussions with writer, rates depression at a 5/10, hopelessness at a 4/10 and anxiety at a 3/10. Rates sleep as good, appetite as good, energy as high and concentration as good.  States goal for today is to "work to get discharged today, talk to my doctor and discuss options." Denies pain, physical problems.   A: Medicated per orders, no prns requested or required. Level III obs in place for safety. Emotional support offered and self inventory reviewed. Encouraged completion of Suicide Safety Plan and programming participation. Discussed POC with MD, SW.   R: Patient verbalizes understanding of POC. Patient denies SI/HI/AVH and remains safe on level III obs. Will continue to monitor closely and make verbal contact frequently.

## 2017-07-08 NOTE — Plan of Care (Signed)
Problem: Safety: Goal: Periods of time without injury will increase Outcome: Completed/Met Date Met: 07/08/17 Patient has not engaged in self harm, denies thoughts to do so. No SI.  Problem: Medication: Goal: Compliance with prescribed medication regimen will improve Outcome: Completed/Met Date Met: 07/08/17 Patient is med compliant and verbalizes intention to remain so upon discharge.

## 2017-07-08 NOTE — [facility] Suicide Risk Assessment (Signed)
[facility] INPATIENT:  Family/Significant Other Suicide Prevention Education  Suicide Prevention Education:  Education Completed; Anne Duarte, Pt's [phone], has been identified by the patient as the family member/significant other with whom the patient will be residing, and identified as the person(s) who will aid the patient in the event of a mental health crisis (suicidal ideations/suicide attempt).  With written consent from the patient, the family member/significant other has been provided the following suicide prevention education, prior to the and/or following the discharge of the patient.  The suicide prevention education provided includes the following:  Suicide risk factors  Suicide prevention and interventions  National Suicide Hotline telephone number  [facility] assessment telephone number  [facility] Emergency Assistance 911  [facility] and/or Residential Mobile Crisis Unit telephone number  Request made of family/significant other to:  Remove weapons (e.g., guns, rifles, knives), all items previously/currently identified as safety concern.    Remove drugs/medications (over-the-counter, prescriptions, illicit drugs), all items previously/currently identified as a safety concern.  The family member/significant other verbalizes understanding of the suicide prevention education information provided.  The family member/significant other agrees to remove the items of safety concern listed above.  [name] expresses that he feels Pt will be safe to discharge. He reports she is hopeful to work with the [employer] at school to make an appropriate academic plan considering her depression's impact on her schooling.   Anne Duarte 07/08/2017, 4:25 PM

## 2017-07-08 NOTE — Tx Team (Signed)
Interdisciplinary Treatment and Diagnostic Plan Update  07/08/2017 Time of Session: 9:30am Anne Duarte MRN: [id number]  Principal Diagnosis: MDD (major depressive disorder), recurrent severe, without psychosis ([place])  Secondary Diagnoses: Principal Problem:   MDD (major depressive disorder), recurrent severe, without psychosis ([place]) Active Problems:   MDD (major depressive disorder), single episode, severe , no psychosis ([place])   Current Medications:  Current Facility-Administered Medications  Medication Dose Route Frequency Provider Last Rate Last Dose  . acetaminophen (TYLENOL) tablet 650 mg  650 mg Oral Q6H PRN Ethelene Hal, NP   650 mg at 07/06/17 2129  . alum & mag hydroxide-simeth (MAALOX/MYLANTA) 200-200-20 MG/5ML suspension 30 mL  30 mL Oral Q4H PRN Ethelene Hal, NP      . hydrOXYzine (ATARAX/VISTARIL) tablet 25 mg  25 mg Oral TID PRN Money, Lowry Ram, FNP      . loratadine (CLARITIN) tablet 10 mg  10 mg Oral Daily Ethelene Hal, NP   10 mg at 07/08/17 0800  . magnesium hydroxide (MILK OF MAGNESIA) suspension 30 mL  30 mL Oral Daily PRN Ethelene Hal, NP      . QUEtiapine (SEROQUEL) tablet 50 mg  50 mg Oral QHS Money, Lowry Ram, FNP   50 mg at 07/07/17 2208  . sertraline (ZOLOFT) tablet 75 mg  75 mg Oral Daily Arfeen, Arlyce Harman, MD   75 mg at 07/08/17 0800  . traZODone (DESYREL) tablet 50 mg  50 mg Oral QHS PRN Rankin, Shuvon B, NP        PTA Medications: Prescriptions Prior to Admission  Medication Sig Dispense Refill Last Dose  . acetaminophen (TYLENOL) 500 MG tablet Take 1,000 mg by mouth every 6 (six) hours as needed for moderate pain.   Past Week at Unknown time  . cetirizine (ZYRTEC) 10 MG tablet Take 10 mg by mouth daily.   07/02/2017 at Unknown time  . diphenhydrAMINE (BENADRYL) 25 mg capsule Take 25 mg by mouth every 6 (six) hours as needed (migraines).   Past Month at Unknown time  . hydrOXYzine (ATARAX/VISTARIL) 25 MG tablet Take 1 tablet  by mouth daily.   more than 30 days  . pseudoephedrine (SUDAFED) 30 MG tablet Take 30 mg by mouth daily as needed for congestion.   07/02/2017 at Unknown time  . sertraline (ZOLOFT) 50 MG tablet Take 50 mg by mouth daily.  2 07/03/2017 at Unknown time    Treatment Modalities: Medication Management, Group therapy, Case management,  1 to 1 session with clinician, Psychoeducation, Recreational therapy.  Patient Stressors: Educational concerns Marital or family conflict  Patient Strengths: Curator fund of knowledge Physical Health  Physician Treatment Plan for Primary Diagnosis: MDD (major depressive disorder), recurrent severe, without psychosis ([place]) Long Term Goal(s): Improvement in symptoms so as ready for discharge  Short Term Goals: Ability to verbalize feelings will improve Ability to disclose and discuss suicidal ideas Ability to maintain clinical measurements within normal limits will improve Compliance with prescribed medications will improve  Medication Management: Evaluate patient's response, side effects, and tolerance of medication regimen.  Therapeutic Interventions: 1 to 1 sessions, Unit Group sessions and Medication administration.  Evaluation of Outcomes: Progressing  Physician Treatment Plan for Secondary Diagnosis: Principal Problem:   MDD (major depressive disorder), recurrent severe, without psychosis ([place]) Active Problems:   MDD (major depressive disorder), single episode, severe , no psychosis ([place])   Long Term Goal(s): Improvement in symptoms so as ready for discharge  Short Term Goals: Ability to  verbalize feelings will improve Ability to disclose and discuss suicidal ideas Ability to maintain clinical measurements within normal limits will improve Compliance with prescribed medications will improve  Medication Management: Evaluate patient's response, side effects, and tolerance of medication regimen.  Therapeutic Interventions: 1 to  1 sessions, Unit Group sessions and Medication administration.  Evaluation of Outcomes: Progressing   RN Treatment Plan for Primary Diagnosis: MDD (major depressive disorder), recurrent severe, without psychosis ([place]) Long Term Goal(s): Knowledge of disease and therapeutic regimen to maintain health will improve  Short Term Goals: Ability to disclose and discuss suicidal ideas, Ability to identify and develop effective coping behaviors will improve and Compliance with prescribed medications will improve  Medication Management: RN will administer medications as ordered by provider, will assess and evaluate patient's response and provide education to patient for prescribed medication. RN will report any adverse and/or side effects to prescribing provider.  Therapeutic Interventions: 1 on 1 counseling sessions, Psychoeducation, Medication administration, Evaluate responses to treatment, Monitor vital signs and CBGs as ordered, Perform/monitor CIWA, COWS, AIMS and Fall Risk screenings as ordered, Perform wound care treatments as ordered.  Evaluation of Outcomes: Progressing   LCSW Treatment Plan for Primary Diagnosis: MDD (major depressive disorder), recurrent severe, without psychosis ([place]) Long Term Goal(s): Safe transition to appropriate next level of care at discharge, Engage patient in therapeutic group addressing interpersonal concerns.  Short Term Goals: Engage patient in aftercare planning with referrals and resources, Identify triggers associated with mental health/substance abuse issues and Increase skills for wellness and recovery  Therapeutic Interventions: Assess for all discharge needs, 1 to 1 time with Social worker, Explore available resources and support systems, Assess for adequacy in community support network, Educate family and significant other(s) on suicide prevention, Complete Psychosocial Assessment, Interpersonal group therapy.  Evaluation of Outcomes: Not  Met   Progress in Treatment: Attending groups: Yes  Participating in groups: Yes Taking medication as prescribed: Yes, MD continues to assess for medication changes as needed Toleration medication: Yes, no side effects reported at this time Family/Significant other contact made: No, CSW attempting to make contact with father Patient understands diagnosis: Continuing to assess Discussing patient identified problems/goals with staff: Yes Medical problems stabilized or resolved: Yes Denies suicidal/homicidal ideation: Yes Issues/concerns per patient self-inventory: None Other: N/A  New problem(s) identified: None identified at this time.   New Short Term/Long Term Goal(s): None identified at this time.   Discharge Plan or Barriers: Pt will return home and follow-up [facility]  Reason for Continuation of Hospitalization: Anxiety Depression Medication stabilization  Estimated Length of Stay: 2-3 days; est DC date 07/11/17  Attendees: Patient:  07/08/2017  8:57 AM  Physician: Dr. Parke Poisson, MD 07/08/2017  8:57 AM  Nursing: Broadus John, RN 07/08/2017  8:57 AM  RN Care Manager: Lars Pinks, RN 07/08/2017  8:57 AM  Social Worker: Adriana Reams, LCSW 07/08/2017  8:57 AM  Recreational Therapist:  07/08/2017  8:57 AM  Other: Lindell Spar, NP; Marvia Pickles, NP 07/08/2017  8:57 AM  Other:  07/08/2017  8:57 AM  Other: 07/08/2017  8:57 AM    Scribe for Treatment Team: Gladstone Lighter, LCSW 07/08/2017 8:57 AM

## 2017-07-08 NOTE — Progress Notes (Signed)
Recreation Therapy Notes  Date: 07/08/17 Time: 0930 Location: 400 Hall Dayroom  Group Topic: Stress Management  Goal Area(s) Addresses:  Patient will verbalize importance of using healthy stress management.  Patient will identify positive emotions associated with healthy stress management.   Behavioral Response: Engaged  Intervention: Stress Management  Activity :  Meditation.   LRT introduced the stress management technique of meditation.  LRT played a meditation from the Calm app that focused on scanning the body.  Patients were to focus on and make not of whatever sensations they were feeling.  Education:  Stress Management, Discharge Planning.   Education Outcome: Acknowledges edcuation/In group clarification offered/Needs additional education  Clinical Observations/Feedback: Pt attended group.   Caroll RancherMarjette Pierson Vantol, LRT/CTRS        Caroll RancherLindsay, Naidelin Gugliotta A 07/08/2017 11:52 AM

## 2017-07-08 NOTE — Progress Notes (Signed)
D   Pt is pleasant on approach and cooperative   She interacts well with her peers and is compliant with treatment   She reports improved mood and less depression  Is making hopeful statements  She attends and participates in groups A   Verbal support given   Medications administered and effectiveness monitored   Q 15 min checks  R   Pt is safe and receptive to verbal support

## 2017-07-09 MED ORDER — TRAZODONE HCL 50 MG PO TABS: 50.0000 mg | ORAL_TABLET | Freq: Every evening | ORAL | 0 refills | Status: DC | PRN

## 2017-07-09 MED ORDER — SERTRALINE HCL 25 MG PO TABS: 75.0000 mg | ORAL_TABLET | Freq: Every day | ORAL | 0 refills | Status: DC

## 2017-07-09 MED ORDER — QUETIAPINE FUMARATE 50 MG PO TABS: 50.0000 mg | ORAL_TABLET | Freq: Every day | ORAL | 0 refills | Status: DC

## 2017-07-09 MED ORDER — HYDROXYZINE HCL 25 MG PO TABS: 25.0000 mg | ORAL_TABLET | Freq: Three times a day (TID) | ORAL | 0 refills | Status: DC | PRN

## 2017-07-09 NOTE — Discharge Summary (Signed)
Physician Discharge Summary Note  Patient:  Anne Duarte is an 21 y.o., female MRN:  [id number] DOB:  08/30/1996 Patient phone:  [phone] (home)  Patient address:   [address] [place] [address],  Total Time spent with patient: 20 minutes  Date of Admission:  07/04/2017 Date of Discharge: 07/09/17   Reason for Admission:  Worsening depression with SI and plan to OD  Principal Problem: MDD (major depressive disorder), recurrent severe, without psychosis [facility]) Discharge Diagnoses: Patient Active Problem List   Diagnosis Date Noted  . MDD (major depressive disorder), recurrent severe, without psychosis (HCC) [F33.2] 07/04/2017  . MDD (major depressive disorder), single episode, severe , no psychosis (HCC) [F32.2] 07/04/2017    Past Psychiatric History: GAD, MDD, SI  Past Medical History:  Past Medical History:  Diagnosis Date  . Anxiety     Past Surgical History:  Procedure Laterality Date  . TYMPANOSTOMY TUBE PLACEMENT    . WISDOM TOOTH EXTRACTION     Family History: History reviewed. No pertinent family history. Family Psychiatric  History: Denies Social History:  History  Alcohol Use No     History  Drug Use No    Social History   Social History  . Marital status: Single    Spouse name: N/A  . Number of children: N/A  . Years of education: N/A   Social History Main Topics  . Smoking status: Never Smoker  . Smokeless tobacco: Never Used  . Alcohol use No  . Drug use: No  . Sexual activity: No   Other Topics Concern  . None   Social History Narrative  . None    Hospital Course:   Anne Duarte is a 21 year old female being admitted voluntarily to [facility]2 from the OBS unit. She is being admitted for suicidal ideation and plan to OD. She denies any history of previous attempts in the past. She denied HI or A/V hallucinations. She is diagnosed with Major Depressive Disorder and Generalized Anxiety Disorder. [facility] she stated that her  suicidal thoughts come and go. They are worse when she is in school. She denies any medical issues and appears to be in no physical distress.  Patient states that she has a lot of issues leaving her apartment due to her paranoia. She states that she has friend that usually is able to go with her to her classes but hasn't been able to this semester. She denies any SI/HI/AVH currently, but still depressed. She reports that the Zoloft was started earlier this year , but hasn't seen a significant improvement. She reports having bad dreams about being sexually assaulted or being abducted, but this has never happened to her in the past. She tried Prazosin in the past and she had elevated heart rate and SOB. She agreed to continue Zoloft and will add Seroquel to improve her sleep and depression.  Patient remained on the unit for 3 days and stabilized on the Seroquel and Zoloft. She denies any SI/HI/AVH and reports no paranoia while here, but she agrees it may change when she gets back to college. She has been very pleasant and cooperative. She has been seen in the day room interacting and attending group. She states she is going to have a crisis meeting with [employer] for school. She is provided with prescriptions for her medications. She reports that her father is picking her up and she will be staying with them until school is decided. She has agreed to follow up with  [employer] counselors.  Physical Findings: AIMS: Facial and Oral Movements Muscles of Facial Expression: None, normal Lips and Perioral Area: None, normal Jaw: None, normal Tongue: None, normal,Extremity Movements Upper (arms, wrists, hands, fingers): None, normal Lower (legs, knees, ankles, toes): None, normal, Trunk Movements Neck, shoulders, hips: None, normal, Overall Severity Severity of abnormal movements (highest score from questions above): None, normal Incapacitation due to abnormal movements: None, normal Patient's awareness of  abnormal movements (rate only patient's report): No Awareness, Dental Status Current problems with teeth and/or dentures?: No Does patient usually wear dentures?: No  CIWA:    COWS:     Musculoskeletal: Strength & Muscle Tone: within normal limits Gait & Station: normal Patient leans: N/A  Psychiatric Specialty Exam: Physical Exam  Nursing note and vitals reviewed. Constitutional: She is oriented to person, place, and time. She appears well-developed and well-nourished.  Respiratory: Effort normal.  Musculoskeletal: Normal range of motion.  Neurological: She is alert and oriented to person, place, and time.  Skin: Skin is warm.    Review of Systems  Constitutional: Negative.   HENT: Negative.   Eyes: Negative.   Respiratory: Negative.   Cardiovascular: Negative.   Gastrointestinal: Negative.   Genitourinary: Negative.   Musculoskeletal: Negative.   Skin: Negative.   Neurological: Negative.   Endo/Heme/Allergies: Negative.     Blood pressure 103/64, pulse (!) 104, temperature 98.5 F (36.9 C), temperature source Oral, resp. rate 20, height  (1.651 m), weight 77.1 kg (169 lb 15.6 oz).Body mass index is 28.29 kg/m.  General Appearance: Casual  Eye Contact:  Good  Speech:  Clear and Coherent and Normal Rate  Volume:  Normal  Mood:  Euthymic  Affect:  Appropriate  Thought Process:  Coherent and Descriptions of Associations: Intact  Orientation:  Full (Time, Place, and Person)  Thought Content:  WDL  Suicidal Thoughts:  No  Homicidal Thoughts:  No  Memory:  Immediate;   Good Recent;   Good Remote;   Good  Judgement:  Good  Insight:  Good  Psychomotor Activity:  Normal  Concentration:  Concentration: Good and Attention Span: Good  Recall:  Good  Fund of Knowledge:  Good  Language:  Good  Akathisia:  No  Handed:  Right  AIMS (if indicated):     Assets:  Desire for Improvement Financial Resources/Insurance Housing Physical Health Social  Support Transportation  ADL's:  Intact  Cognition:  WNL  Sleep:  Number of Hours: 6.75     Have you used any form of tobacco in the last 30 days? (Cigarettes, Smokeless Tobacco, Cigars, and/or Pipes): No  Has this patient used any form of tobacco in the last 30 days? (Cigarettes, Smokeless Tobacco, Cigars, and/or Pipes) Yes, No  Blood Alcohol level:  Lab Results  Component Value Date   ETH <5 07/03/2017    Metabolic Disorder Labs:  No results found for: HGBA1C, MPG No results found for: PROLACTIN No results found for: CHOL, TRIG, HDL, CHOLHDL, VLDL, LDLCALC  See Psychiatric Specialty Exam and Suicide Risk Assessment completed by Attending Physician prior to discharge.  Discharge destination:  Home  Is patient on multiple antipsychotic therapies at discharge:  No   Has Patient had three or more failed trials of antipsychotic monotherapy by history:  No  Recommended Plan for Multiple Antipsychotic Therapies: NA   Allergies as of 07/09/2017      Reactions   Prazosin Hcl Other (See Comments)   Rapid heart beat/out of breath      Medication List  STOP taking these medications   acetaminophen 500 MG tablet Commonly known as:  TYLENOL   diphenhydrAMINE 25 mg capsule Commonly known as:  BENADRYL   pseudoephedrine 30 MG tablet Commonly known as:  SUDAFED     TAKE these medications     Indication  cetirizine 10 MG tablet Commonly known as:  ZYRTEC Take 10 mg by mouth daily.  Indication:  Hayfever   hydrOXYzine 25 MG tablet Commonly known as:  ATARAX/VISTARIL Take 1 tablet (25 mg total) by mouth 3 (three) times daily as needed for anxiety. What changed:  when to take this  reasons to take this  Indication:  Feeling Anxious   QUEtiapine 50 MG tablet Commonly known as:  SEROQUEL Take 1 tablet (50 mg total) by mouth at bedtime. For mood control  Indication:  mood stability   sertraline 25 MG tablet Commonly known as:  ZOLOFT Take 3 tablets (75 mg total)  by mouth daily. What changed:  medication strength  how much to take  Indication:  mood stability   traZODone 50 MG tablet Commonly known as:  DESYREL Take 1 tablet (50 mg total) by mouth at bedtime as needed for sleep.  Indication:  Trouble Sleeping      Follow-up Information    [facility] Follow up.   Why:  9/19 at 11:20am medication management appointment with [facility]. 9/18 at 9:00am at the [facility] office for your crisis management meeting.  Contact information: [address] [place], [place] [address] Phone: [phone] Fax: [phone]          Follow-up recommendations:  Continue activity as tolerated. Continue diet as recommended by your PCP. Ensure to keep all appointments with outpatient providers.  Comments:  Patient is instructed prior to discharge to: Take all medications as prescribed by his/her mental healthcare provider. Report any adverse effects and or reactions from the medicines to his/her outpatient provider promptly. Patient has been instructed & cautioned: To not engage in alcohol and or illegal drug use while on prescription medicines. In the event of worsening symptoms, patient is instructed to call the crisis hotline, 911 and or go to the nearest ED for appropriate evaluation and treatment of symptoms. To follow-up with his/her primary care provider for your other medical issues, concerns and or health care needs.    Signed: Gerlene Burdock Money, FNP 07/09/2017, 10:04 AM   Patient seen, Suicide Assessment Completed.  Disposition Plan Reviewed

## 2017-07-09 NOTE — Progress Notes (Signed)
Adult Psychoeducational Group Note  Date:  07/09/2017 Time:  2:50 AM  Group Topic/Focus:  Wrap-Up Group:   The focus of this group is to help patients review their daily goal of treatment and discuss progress on daily workbooks.  Participation Level:  Active  Participation Quality:  Appropriate  Affect:  Appropriate  Cognitive:  Appropriate  Insight: Appropriate  Engagement in Group:  Engaged  Modes of Intervention:  Discussion  Additional Comments:  Pt stated her goal for today was to talk with her doctor about her discharge plan. Pt stated she achieved her goal and felt good about it. Pt rated her overall day a 9 out of 10. Pt stated she attended all groups held today. Pt stated been visited today by her parents help improve her day.  Felipa FurnaceChristopher  Fredonia Casalino 07/09/2017, 2:50 AM

## 2017-07-09 NOTE — Progress Notes (Signed)
Anne Duarte. Anne Duarte had been up and visible in milieu this evening, she denied any SI this evening and spoke about feeling better. She did endorse some anxiety and was able to receive medications without incident. She spoke about how she is hopeful for discharge in the morning and did not verbalize any complaints of pain. A. Support and encouragement provided. R. Safety maintained, will continue to monitor.

## 2017-07-09 NOTE — Progress Notes (Signed)
Patient verbalizes readiness for discharge. Follow up plan explained, AVS, transition record and SRA given along with prescriptions. All belongings returned. Patient verbalizes understanding. Denies SI/HI and assures this Clinical research associatewriter she will seek assistance should that change. Patient discharged ambulatory and in stable condition to father.

## 2017-07-09 NOTE — Progress Notes (Signed)
  [facility] Discharge [facility] :  Will you be returning to the same living situation after discharge:  Yes,  pt returning home. At discharge, do you have transportation home?: Yes,  pt's father will transport. Do you have the ability to pay for your medications: Yes,  pt has insurance.  Release of information consent forms completed and in the chart;  Patient's signature needed at discharge.  Patient to Follow up at: Follow-up Information    [facility].   Why:  9/19 at 11:20am [facility]. 9/18 at 9:00am at [facility] office for your crisis management meeting.  Contact information: [address] [place], [place] [address] Phone: [phone] Fax: [phone]          Next level of care provider has [facility] to [facility] Link:no  Safety Planning and Suicide Prevention discussed: Yes,  with pt and with pt's father.  Have you used any form of tobacco in the last 30 days? (Cigarettes, Smokeless Tobacco, Cigars, and/or Pipes): No  Has patient been referred to the Quitline?: N/A patient is not a smoker  Patient has been referred for addiction treatment: N/A  Jonathon JordanLynn B Sayan Aldava, MSW, LCSWA 07/09/2017, 9:43 AM

## 2017-07-09 NOTE — Progress Notes (Signed)
D: Patient observed resting in bed however promptly came up for meds. Now observed in the dayroom interacting. Patient verbalizes to this Clinical research associatewriter her desire to speak with case worker and possibly discharge today. "I feel ready to go but I just need to talk to her first about my plan." Patient's affect anxious with congruent, pleasant mood. Per self inventory and discussions with writer, rates depression at a 4/10, hopelessness at a /10 and anxiety at a 3/10. Rates sleep as good, appetite as good, energy as high and concentration as good.  States goal for today is to "work on discharge plans, talk to my doctor and case worker." Denies pain, physical problems.   A: Medicated per orders, no prns requested or required. Level III obs in place for safety. Emotional support offered and self inventory reviewed. Encouraged completion of Suicide Safety Plan and programming participation. Discussed POC with MD, SW.  R: Patient verbalizes understanding of POC. Patient denies SI/HI/AVH and remains safe on level III obs. Will continue to monitor closely and make verbal contact frequently.

## 2017-07-09 NOTE — [facility] Suicide Risk Assessment (Signed)
[facility]   Principal Problem: MDD (major depressive disorder), recurrent severe, without psychosis (HCC) Discharge Diagnoses:  Patient Active Problem List   Diagnosis Date Noted  . MDD (major depressive disorder), recurrent severe, without psychosis (HCC) [F33.2] 07/04/2017  . MDD (major depressive disorder), single episode, severe , no psychosis (HCC) [F32.2] 07/04/2017    Total Time spent with patient: 30 minutes  Musculoskeletal: Strength & Muscle Tone: within normal limits Gait & Station: normal Patient leans: N/A  Psychiatric Specialty Exam: ROS no headache, no chest pain, no shortness of breath, no vomiting,no nausea, no rash  Blood pressure 103/64, pulse (!) 104, temperature 98.5 F (36.9 C), temperature source Oral, resp. rate 20, height 5\' 5"  (1.651 m), weight 77.1 kg (169 lb 15.6 oz).Body mass index is 28.29 kg/m.  General Appearance: Well Groomed  Eye Contact::  Good  Speech:  Normal Rate409  Volume:  Normal  Mood:  improved, currently euthymic  Affect:  Appropriate and Full Range  Thought Process:  Linear and Descriptions of Associations: Intact  Orientation:  Full (Time, Place, and Person)  Thought Content:  no hallucinations, no delusions, not internally preoccupied   Suicidal Thoughts:  No denies any suicidal or self injurious ideations  Homicidal Thoughts:  No denies any homicidal ideations   Memory:  recent and remote grossly intact   Judgement:  Other:  improving   Insight:  improving   Psychomotor Activity:  Normal  Concentration:  Good  Recall:  Good  Fund of Knowledge:Good  Language: Good  Akathisia:  Negative  Handed:  Right  AIMS (if indicated):   no abnormal or involuntary movements noted or reported   Assets:  Communication Skills Desire for Improvement Resilience  Sleep:  Number of Hours: 6.75  Cognition: WNL  ADL's:  Intact   Mental Status Per Nursing Assessment::   On Admission:  Suicide plan  Demographic  Factors:  21 year old single female, no children, college Consulting civil engineerstudent.   Loss Factors: Academic stressors   Historical Factors: No prior psychiatric admissions, no history of prior suicide attempts, no prior history of self cutting  Risk Reduction Factors:   Sense of responsibility to family, Positive social support and Positive coping skills or problem solving skills  Continued Clinical Symptoms:  At this time she presents improved compared to admission. She is fully alert, attentive, calm, well related, describes mood as improved, presents euthymic, affect is reactive, bright,no thought disorder, no suicidal or self injurious ideations, no homicidal ideations, no psychotic symptoms. Denies medication side effects- side effects discussed  Behavior on unit calm and in good control, interacting appropriately with peers, pleasant on approach  Cognitive Features That Contribute To Risk:  No gross cognitive deficits noted upon discharge. Is alert , attentive, and oriented x 3   Suicide Risk:  Mild:  Suicidal ideation of limited frequency, intensity, duration, and specificity.  There are no identifiable plans, no associated intent, mild dysphoria and related symptoms, good self-control (both objective and subjective assessment), few other risk factors, and identifiable protective factors, including available and accessible social support.  Follow-up Information    [facility] Follow up.   Why:  [facility]19 [facility] 11[facility]20[facility] 9:00am [facility] for your crisis management meeting.  Contact information: [address] [place], [place] [address] Phone: [phone] Fax: [phone]          Plan Of Care/Follow-up recommendations:  Activity:  as tolerated  Diet:  regular Tests:  NA Other:  see below  Patient is leaving unit in good spirits  States father will be picking her up later today Follow up as  above.  Craige Cotta, MD 07/09/2017, 9:37 AM

## 2019-11-23 ENCOUNTER — Ambulatory Visit: Payer: 59 | Attending: Internal Medicine

## 2019-11-23 DIAGNOSIS — Z20822 Contact with and (suspected) exposure to covid-19: Secondary | ICD-10-CM

## 2019-11-24 LAB — NOVEL CORONAVIRUS, NAA: SARS-CoV-2, NAA: NOT DETECTED

## 2019-12-21 ENCOUNTER — Other Ambulatory Visit: Payer: Self-pay

## 2019-12-21 ENCOUNTER — Other Ambulatory Visit: Payer: Self-pay | Admitting: Behavioral Health

## 2019-12-21 ENCOUNTER — Observation Stay (HOSPITAL_COMMUNITY)
Admission: AD | Admit: 2019-12-21 | Discharge: 2019-12-21 | Disposition: A | Discharge status: Home / Self Care | Payer: BC Managed Care – PPO | Source: Intra-hospital | Attending: Internal Medicine | Admitting: Internal Medicine

## 2019-12-21 ENCOUNTER — Inpatient Hospital Stay (HOSPITAL_COMMUNITY)
Admission: AD | Admit: 2019-12-21 | Discharge: 2019-12-22 | DRG: 178 | Disposition: A | Discharge status: Home / Self Care | Payer: BC Managed Care – PPO | Source: Ambulatory Visit | Attending: Internal Medicine | Admitting: Internal Medicine

## 2019-12-21 ENCOUNTER — Encounter (HOSPITAL_COMMUNITY): Payer: Self-pay | Admitting: Psychiatry

## 2019-12-21 ENCOUNTER — Encounter (HOSPITAL_COMMUNITY): Payer: Self-pay | Admitting: Internal Medicine

## 2019-12-21 DIAGNOSIS — U071 COVID-19: Secondary | ICD-10-CM | POA: Diagnosis present

## 2019-12-21 DIAGNOSIS — F332 Major depressive disorder, recurrent severe without psychotic features: Secondary | ICD-10-CM | POA: Diagnosis not present

## 2019-12-21 DIAGNOSIS — Z7289 Other problems related to lifestyle: Secondary | ICD-10-CM

## 2019-12-21 DIAGNOSIS — Z6836 Body mass index (BMI) 36.0-36.9, adult: Secondary | ICD-10-CM | POA: Diagnosis not present

## 2019-12-21 DIAGNOSIS — F419 Anxiety disorder, unspecified: Secondary | ICD-10-CM | POA: Diagnosis present

## 2019-12-21 DIAGNOSIS — R45851 Suicidal ideations: Principal | ICD-10-CM | POA: Insufficient documentation

## 2019-12-21 DIAGNOSIS — Z818 Family history of other mental and behavioral disorders: Secondary | ICD-10-CM

## 2019-12-21 DIAGNOSIS — E669 Obesity, unspecified: Secondary | ICD-10-CM | POA: Diagnosis present

## 2019-12-21 DIAGNOSIS — Z713 Dietary counseling and surveillance: Secondary | ICD-10-CM | POA: Diagnosis not present

## 2019-12-21 DIAGNOSIS — Z888 Allergy status to other drugs, medicaments and biological substances status: Secondary | ICD-10-CM

## 2019-12-21 LAB — RESPIRATORY PANEL BY RT PCR (FLU A&B, COVID)
Influenza A by PCR: NEGATIVE
Influenza B by PCR: NEGATIVE
SARS Coronavirus 2 by RT PCR: POSITIVE — AB

## 2019-12-21 MED ORDER — GUAIFENESIN-DM 100-10 MG/5ML PO SYRP: 10.0000 mL | ORAL_SOLUTION | ORAL | Status: DC | PRN

## 2019-12-21 MED ORDER — ACETAMINOPHEN 325 MG PO TABS: 650.0000 mg | ORAL_TABLET | Freq: Four times a day (QID) | ORAL | Status: DC | PRN

## 2019-12-21 MED ORDER — ZINC SULFATE 220 (50 ZN) MG PO CAPS: 220.0000 mg | ORAL_CAPSULE | Freq: Every day | ORAL | Status: DC

## 2019-12-21 MED ORDER — ONDANSETRON HCL 4 MG/2ML IJ SOLN: 4.0000 mg | Freq: Four times a day (QID) | INTRAMUSCULAR | Status: DC | PRN

## 2019-12-21 MED ORDER — ONDANSETRON HCL 4 MG PO TABS: 4.0000 mg | ORAL_TABLET | Freq: Four times a day (QID) | ORAL | Status: DC | PRN

## 2019-12-21 MED ORDER — IPRATROPIUM-ALBUTEROL 20-100 MCG/ACT IN AERS
1.0000 | INHALATION_SPRAY | Freq: Four times a day (QID) | RESPIRATORY_TRACT | Status: DC | PRN
  Filled 2019-12-21: qty 4

## 2019-12-21 MED ORDER — IPRATROPIUM-ALBUTEROL 20-100 MCG/ACT IN AERS: 1.0000 | INHALATION_SPRAY | Freq: Four times a day (QID) | RESPIRATORY_TRACT | Status: DC | PRN

## 2019-12-21 MED ORDER — ASCORBIC ACID 500 MG PO TABS
500.0000 mg | ORAL_TABLET | Freq: Every day | ORAL | Status: DC
  Administered 2019-12-22: 500 mg via ORAL
  Filled 2019-12-21: qty 1

## 2019-12-21 MED ORDER — DIPHENHYDRAMINE HCL 25 MG PO CAPS: 25.0000 mg | ORAL_CAPSULE | Freq: Four times a day (QID) | ORAL | Status: DC | PRN

## 2019-12-21 MED ORDER — ASCORBIC ACID 500 MG PO TABS: 500.0000 mg | ORAL_TABLET | Freq: Every day | ORAL | Status: DC

## 2019-12-21 MED ORDER — DOCUSATE SODIUM 100 MG PO CAPS: 100.0000 mg | ORAL_CAPSULE | Freq: Every day | ORAL | Status: DC

## 2019-12-21 MED ORDER — ZINC SULFATE 220 (50 ZN) MG PO CAPS
220.0000 mg | ORAL_CAPSULE | Freq: Every day | ORAL | Status: DC
  Administered 2019-12-22: 09:00:00 220 mg via ORAL
  Filled 2019-12-21: qty 1

## 2019-12-21 MED ORDER — ENOXAPARIN SODIUM 40 MG/0.4ML ~~LOC~~ SOLN: 40.0000 mg | SUBCUTANEOUS | Status: DC

## 2019-12-21 MED ORDER — ENOXAPARIN SODIUM 40 MG/0.4ML ~~LOC~~ SOLN
40.0000 mg | SUBCUTANEOUS | Status: DC
  Administered 2019-12-21: 40 mg via SUBCUTANEOUS
  Filled 2019-12-21: qty 0.4

## 2019-12-21 MED ORDER — QUETIAPINE FUMARATE 25 MG PO TABS
50.0000 mg | ORAL_TABLET | Freq: Every day | ORAL | Status: DC
  Administered 2019-12-21: 50 mg via ORAL
  Filled 2019-12-21: qty 2

## 2019-12-21 MED ORDER — DOCUSATE SODIUM 100 MG PO CAPS
100.0000 mg | ORAL_CAPSULE | Freq: Every day | ORAL | Status: DC
  Filled 2019-12-21: qty 1

## 2019-12-21 NOTE — H&P (Signed)
TRH H&P   Patient Demographics:    Anne Duarte, is a 24 y.o. female  MRN: [id number]   DOB - 15-Nov-1995  Admit Date - 12/21/2019  Outpatient Primary MD for the patient is Patient, No Pcp Per  Patient coming from: [facility]  No chief complaint on file.     HPI:    Anne Duarte  is a 24 y.o. female, with obesity, depression, presents as a transfer from the [facility] where she presented with suicidal ideation, tested positive for SARS-CoV-2 and subsequently transferred to [facility] for management.  Patient is a Consulting civil engineer at [employer], she has history of MDD with suicidal ideations, was admitted to the [facility] in 2018.  She reports that she takes Seroquel 200 mg nightly for sleep.  She used to be on Zoloft, she weaned herself off in 08/2019.  Admits to suicidal ideation, she had considered overdosing on medication.  She denies access to firearms.  He has Seroquel as prescribed by her psychiatrist, she does not recall the last time she had an appointment.  Reports that her father had SARS-CoV-2 infection in January, she had a negative PCR 11/23/2019.  She admits to rhinorrhea for last few days.  She denies any cough, shortness of breath, chest pain, palpitations, orthopnea, PND.  She denies any fever, chills or night sweats.  She denies any nausea, vomiting, diarrhea.  She denies any anosmia or ageusia.    Review of systems:  Review of Systems:  Constitutional: negative for anorexia, chills, fatigue or fevers HEENT: negative for earaches, epistaxis, or sore throat Respiratory: See HPI Cardiovascular: negative for chest pain, palpitations, or syncope GU: negative for dysuria, urinary  frequency, urinary urgency, hematuria Gastrointestinal: negative for abdominal pain, constipation, diarrhea, nausea or vomiting Musculoskeletal: negative for arthralgias, back pain or myalgias Neurological: negative for dizziness, headaches or weakness Behavioral/Psych:see HPI Skin:negative for rash Heme: negative for bruises Endo: negative for hair loss, weight gain/loss  With Past History of the following :   Past Medical History:  Diagnosis Date  . Anxiety       Past Surgical History:  Procedure Laterality Date  . TYMPANOSTOMY TUBE PLACEMENT    . WISDOM TOOTH EXTRACTION       Social History:     Social History   Tobacco Use  . Smoking status:  Never Smoker  . Smokeless tobacco: Never Used  Substance Use Topics  . Alcohol use: No     Family History :     Family History  Problem Relation Age of Onset  . Diabetes Mother   . Heart murmur Mother     Home Medications:   Prior to Admission medications   Medication Sig Start Date End Date Taking? Authorizing Provider  cetirizine (ZYRTEC) 10 MG tablet Take 10 mg by mouth daily.    [provider]  hydrOXYzine (ATARAX/VISTARIL) 25 MG tablet Take 1 tablet (25 mg total) by mouth 3 (three) times daily as needed for anxiety. 07/09/17   Money, Lowry Ram, FNP  QUEtiapine (SEROQUEL) 50 MG tablet Take 1 tablet (50 mg total) by mouth at bedtime. For mood control 07/09/17   Money, Lowry Ram, FNP  sertraline (ZOLOFT) 25 MG tablet Take 3 tablets (75 mg total) by mouth daily. 07/10/17   Money, Lowry Ram, FNP  traZODone (DESYREL) 50 MG tablet Take 1 tablet (50 mg total) by mouth at bedtime as needed for sleep. 07/09/17   Money, Lowry Ram, FNP     Allergies:     Allergies  Allergen Reactions  . Minipress [Prazosin] Hypertension  . Prazosin Hcl Other (See Comments)    Rapid heart beat/out of breath     Physical Exam:   Vitals  Blood pressure 115/75, pulse 94, temperature 98.1 F (36.7 C), temperature source Oral, resp.  rate 17, height 5\' 5"  (1.651 m), weight 99.8 kg, SpO2 99 %.  Physical Exam   Constitutional - resting comfortably, no acute distress Eyes - pupils equal round and reactive to light and accomodation, extra ocular movements intact Nose - no gross deformity or drainage Mouth - no oral lesions noted Throat - no swelling or erythema Neck - supple, no JVD   CV - (+)S1S2, no murmurs  Resp - CTA bilaterally, no wheezing or crackles,  GI - (+)BS, soft, non-tender, non-distended Extrem - no clubbing, cyanosis, or peripheral edema  Skin - no rashes or wounds Neuro - alert, aware, oriented to person/place/time Psych - normal affect, no anxiety   Patient has Pressure Ulcer on Admission?: no   Data Review:    CBC No results for input(s): WBC, HGB, HCT, PLT, MCV, MCH, MCHC, RDW, LYMPHSABS, MONOABS, EOSABS, BASOSABS, BANDABS in the last 168 hours.  Invalid input(s): NEUTRABS, BANDSABD ------------------------------------------------------------------------------------------------------------------  Chemistries  No results for input(s): NA, K, CL, CO2, GLUCOSE, BUN, CREATININE, CALCIUM, MG, AST, ALT, ALKPHOS, BILITOT in the last 168 hours.  Invalid input(s): GFRCGP ------------------------------------------------------------------------------------------------------------------ CrCl cannot be calculated (Patient's most recent lab result is older than the maximum 21 days allowed.). ------------------------------------------------------------------------------------------------------------------ No results for input(s): TSH, T4TOTAL, T3FREE, THYROIDAB in the last 72 hours.  Invalid input(s): FREET3  Coagulation profile No results for input(s): INR, PROTIME in the last 168 hours. ------------------------------------------------------------------------------------------------------------------- No results for input(s): DDIMER in the last 72  hours. -------------------------------------------------------------------------------------------------------------------  Cardiac Enzymes No results for input(s): CKMB, TROPONINI, MYOGLOBIN in the last 168 hours.  Invalid input(s): CK ------------------------------------------------------------------------------------------------------------------ No results found for: BNP   ---------------------------------------------------------------------------------------------------------------  Urinalysis No results found for: COLORURINE, APPEARANCEUR, LABSPEC, Wallace, GLUCOSEU, HGBUR, BILIRUBINUR, KETONESUR, PROTEINUR, UROBILINOGEN, NITRITE, LEUKOCYTESUR  ----------------------------------------------------------------------------------------------------------------   Imaging Results:    No results found.    Assessment & Plan:    Principal Problem:   Verbalizes suicidal thoughts Active Problems:   MDD (major depressive disorder), recurrent severe, without psychosis ([place])   COVID-19 virus infection   Obesity (BMI 35.0-39.9 without comorbidity)  Verbalizes suicidal thoughts/MDD , recurrent severe, without psychosis: [facility] with [facility] thoughts, screen positive for SARS-CoV-2.  She does not recall the last time she had a visit with a psychiatrist.  Took herself off Zoloft in 06/2019.  Will consult psychiatry for management depression and suicidal ideations.  Suicide precautions.    COVID-19 virus infection: Patient has had rhinorrhea for a few days, no other symptoms at this time.  O2 sats in the upper 90s on room air.  We will continue to observe.  As needed Tylenol for fever.  As needed antitussives for cough.  Nasal decongestants.    Obesity (BMI 35.0-39.9 without comorbidity):Obesity affects all facets of care.  Likely secondary to excessive caloric intake.  Encourage patient to reduce caloric intake  while increasing physical activity and engage in other lifestyle changes that may result in weight loss.     DVT Prophylaxis Lovenox   AM Labs Ordered, also please review Full Orders  Family Communication: Admission, patients condition and plan of care including tests being ordered have been discussed with the patient who indicate understanding and agree with the plan and Code Status.  Code Status Full  Likely DC to  home  Condition GUARDED    Consults called: None    Admission status: Admit to inpatient    Time spent in minutes : 8   Coletta Memos M.D on 12/21/2019 at 11:52 PM  To page go to [email or web address] - password [facility]

## 2019-12-21 NOTE — Progress Notes (Signed)
Pt is COVID Positive, AC Fransico Michael aware.  AC Fransico Michael called EMS for transport to [facility].  AC Kim  Spoke with [facility] Sheria Lang at [facility] for acceptance.  Pt A&O x 4, no distress noted, calm & cooperative.
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
## 2019-12-21 NOTE — Progress Notes (Addendum)
Spoke with Dr. Dwana Curd about the need for an intravenous catheter, continous pulse oximetry/cardiac monitoring. Dr. Dwana Curd stated that the patient vital signs are to be done per unit routine, she does not need an intravenous catheter, continuous cardiac monitoring and pulse oximetry unless her symptoms progress.   [id number]-Dr. Dwana Curd paged, patient is requesting seroquel 200mg  and there is no orders in file; awaiting call back and/or orders to be put in system.
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
## 2019-12-22 LAB — CBC WITH DIFFERENTIAL/PLATELET
Abs Immature Granulocytes: 0.03 10*3/uL (ref 0.00–0.07)
Basophils Absolute: 0.1 10*3/uL (ref 0.0–0.1)
Basophils Relative: 1 %
Eosinophils Absolute: 0.6 10*3/uL — ABNORMAL HIGH (ref 0.0–0.5)
Eosinophils Relative: 7 %
HCT: 39.7 % (ref 36.0–46.0)
Hemoglobin: 12.8 g/dL (ref 12.0–15.0)
Immature Granulocytes: 0 %
Lymphocytes Relative: 30 %
Lymphs Abs: 2.6 10*3/uL (ref 0.7–4.0)
MCH: 27.1 pg (ref 26.0–34.0)
MCHC: 32.2 g/dL (ref 30.0–36.0)
MCV: 83.9 fL (ref 80.0–100.0)
Monocytes Absolute: 0.6 10*3/uL (ref 0.1–1.0)
Monocytes Relative: 7 %
Neutro Abs: 4.8 10*3/uL (ref 1.7–7.7)
Neutrophils Relative %: 55 %
Platelets: 301 10*3/uL (ref 150–400)
RBC: 4.73 MIL/uL (ref 3.87–5.11)
RDW: 14.1 % (ref 11.5–15.5)
WBC: 8.6 10*3/uL (ref 4.0–10.5)
nRBC: 0 % (ref 0.0–0.2)

## 2019-12-22 LAB — RAPID URINE DRUG SCREEN, HOSP PERFORMED
Amphetamines: NOT DETECTED
Barbiturates: NOT DETECTED
Benzodiazepines: NOT DETECTED
Cocaine: NOT DETECTED
Opiates: NOT DETECTED
Tetrahydrocannabinol: NOT DETECTED

## 2019-12-22 LAB — COMPREHENSIVE METABOLIC PANEL
ALT: 20 U/L (ref 0–44)
AST: 14 U/L — ABNORMAL LOW (ref 15–41)
Albumin: 3.9 g/dL (ref 3.5–5.0)
Alkaline Phosphatase: 88 U/L (ref 38–126)
Anion gap: 8 (ref 5–15)
BUN: 17 mg/dL (ref 6–20)
CO2: 26 mmol/L (ref 22–32)
Calcium: 9.1 mg/dL (ref 8.9–10.3)
Chloride: 104 mmol/L (ref 98–111)
Creatinine, Ser: 0.67 mg/dL (ref 0.44–1.00)
GFR calc Af Amer: >60 mL/min (ref >60)
GFR calc non Af Amer: >60 mL/min (ref >60)
Glucose, Bld: 75 mg/dL (ref 70–99)
Potassium: 3.7 mmol/L (ref 3.5–5.1)
Sodium: 138 mmol/L (ref 135–145)
Total Bilirubin: 0.4 mg/dL (ref 0.3–1.2)
Total Protein: 7.1 g/dL (ref 6.5–8.1)

## 2019-12-22 LAB — FERRITIN: Ferritin: 34 ng/mL (ref 11–307)

## 2019-12-22 LAB — C-REACTIVE PROTEIN: CRP: 1 mg/dL — ABNORMAL HIGH (ref <1.0)

## 2019-12-22 LAB — BRAIN NATRIURETIC PEPTIDE: B Natriuretic Peptide: 31.8 pg/mL (ref 0.0–100.0)

## 2019-12-22 LAB — D-DIMER, QUANTITATIVE: D-Dimer, Quant: <0.27 ug/mL-FEU (ref 0.00–0.50)

## 2019-12-22 LAB — PROCALCITONIN: Procalcitonin: <0.1 ng/mL

## 2019-12-22 LAB — LACTATE DEHYDROGENASE: LDH: 152 U/L (ref 98–192)

## 2019-12-22 MED ORDER — ACETAMINOPHEN 325 MG PO TABS: 650.0000 mg | ORAL_TABLET | Freq: Four times a day (QID) | ORAL | Status: DC | PRN

## 2019-12-22 MED ORDER — ZINC SULFATE 220 (50 ZN) MG PO CAPS: 220.0000 mg | ORAL_CAPSULE | Freq: Every day | ORAL | 0 refills | Status: DC

## 2019-12-22 MED ORDER — ASCORBIC ACID 500 MG PO TABS: 500.0000 mg | ORAL_TABLET | Freq: Every day | ORAL | 0 refills | Status: DC

## 2019-12-22 NOTE — Discharge Summary (Signed)
Physician Discharge Summary  Anne Duarte PPJ:[id number] DOB: 1996/07/29 DOA: 12/21/2019  PCP: Patient, No Pcp Per  Admit date: 12/21/2019 Discharge date: 12/22/2019  Admitted From: home  Disposition:  home   Recommendations for Outpatient Follow-up:  1. F/u with outpt psychiatry   Home Health:  none  Discharge Condition:  Stable    CODE STATUS:  Full code   Diet recommendation:  Heart healthy Consultations:  psychiatry Procedures/Studies: . none   Discharge Diagnoses:  Principal Problem:   Verbalizes suicidal thoughts Active Problems:   MDD (major depressive disorder), recurrent severe, without psychosis (HCC)   COVID-19 virus infection   Obesity (BMI 35.0-39.9 without comorbidity)     Brief Summary: Anne Duarte a23 y.o.female,with obesity, depression, presents as a transfer from [facility] where she presented with suicidal ideation, tested positive for SARS-CoV-2 and subsequently transferred to [facility] for management. Patient is a Consulting civil engineer at [employer] has history of MDD with suicidal ideations, was admitted to the [facility] in 2018.She reports that she takes Seroquel 200 mg nightly for sleep. She used to be on Zoloft, she weaned herself off in 08/2019. Admits to suicidal ideation, she had considered overdosing on medication. She denies access to firearms. He has Seroquel as prescribed by her psychiatrist, she does not recall the last time she had an appointment.  Reports that her father had SARS-CoV-2 infection in January, she had a negative PCR 11/23/2019. She admits to rhinorrhea for last few days. She denies any cough, shortness of breath, chest pain, palpitations, orthopnea, PND. She denies any fever, chills or night sweats. She denies any nausea, vomiting, diarrhea.     Hospital Course:  Principal Problem:   Verbalizes suicidal thoughts/ MDD (major depressive disorder), recurrent severe, without  psychosis - she presented voluntarily to [facility] with her parents due to depression with thoughts of suicide such as overdose or cutting herself  - currently on no medications other than bedtime Seroquel - she states she weaned herself off of Zoloft last fall -  a psychiatry consult has been requested and psychiatry feels that she is no longer a harm to herself- she is cleared from their standpoint for discharge  Active Problems:     COVID-19 virus infection - minimal symptoms- we are treating symptomatically only- discussed plans with father    Obesity (BMI 35.0-39.9 without comorbidity) Body mass index is 36.61 kg/m.    Discharge Exam: Vitals:   12/22/19 0432 12/22/19 0700  BP: (!) 101/57 105/65  Pulse: 83 78  Resp: 14 18  Temp: 98.3 F (36.8 C) 98.7 F (37.1 C)  SpO2: 98% 98%   Vitals:   12/21/19 2214 12/21/19 2251 12/22/19 0432 12/22/19 0700  BP: 115/75 115/75 (!) 101/57 105/65  Pulse: 94 94 83 78  Resp: 17 17 14 18   Temp: 98.1 F (36.7 C) 98.1 F (36.7 C) 98.3 F (36.8 C) 98.7 F (37.1 C)  TempSrc: Oral Oral Oral Oral  SpO2: 99%  98% 98%  Weight:  99.8 kg    Height:  5\' 5"  (1.651 m)      General: Pt is alert, awake, not in acute distress Cardiovascular: RRR, S1/S2 +, no rubs, no gallops Respiratory: CTA bilaterally, no wheezing, no rhonchi Abdominal: Soft, NT, ND, bowel sounds + Extremities: no edema, no cyanosis   Discharge Instructions  Discharge Instructions    Diet - low sodium heart healthy   Complete by: As directed    Increase activity slowly   Complete by: As directed  Allergies as of 12/22/2019      Reactions   Minipress [prazosin] Hypertension   Prazosin Hcl Other (See Comments)   Rapid heart beat/out of breath      Medication List    TAKE these medications   acetaminophen 325 MG tablet Commonly known as: TYLENOL Take 2 tablets (650 mg total) by mouth every 6 (six) hours as needed for mild pain or headache (fever >/= 101).    ascorbic acid 500 MG tablet Commonly known as: VITAMIN C Take 1 tablet (500 mg total) by mouth daily. Start taking on: December 23, 2019   cetirizine 10 MG tablet Commonly known as: ZYRTEC Take 10 mg by mouth daily.   hydrOXYzine 25 MG tablet Commonly known as: ATARAX/VISTARIL Take 1 tablet (25 mg total) by mouth 3 (three) times daily as needed for anxiety.   QUEtiapine 200 MG tablet Commonly known as: SEROQUEL Take 200 mg by mouth at bedtime.   zinc sulfate 220 (50 Zn) MG capsule Take 1 capsule (220 mg total) by mouth daily. Start taking on: December 23, 2019       Allergies  Allergen Reactions  . Minipress [Prazosin] Hypertension  . Prazosin Hcl Other (See Comments)    Rapid heart beat/out of breath      No results found.   The results of significant diagnostics from this hospitalization (including imaging, microbiology, ancillary and laboratory) are listed below for reference.     Microbiology: Recent Results (from the past 240 hour(s))  Respiratory Panel by RT PCR (Flu A&B, Covid) - Nasopharyngeal Swab     Status: Abnormal   Collection Time: 12/21/19  3:25 PM   Specimen: Nasopharyngeal Swab  Result Value Ref Range Status   SARS Coronavirus 2 by RT PCR POSITIVE (A) NEGATIVE Final    Comment: RESULT CALLED TO, READ BACK BY AND VERIFIED WITH: [name],[name] @2011  12/21/19 BILLINGSLEY,L (NOTE) SARS-CoV-2 target nucleic acids are DETECTED. SARS-CoV-2 RNA is generally detectable in upper respiratory specimens  during the acute phase of infection. Positive results are indicative of the presence of the identified virus, but do not rule out bacterial infection or co-infection with other pathogens not detected by the test. Clinical correlation with patient history and other diagnostic information is necessary to determine patient infection status. The expected result is Negative. Fact Sheet for Patients:  12/23/19 Fact Sheet for  Healthcare Providers: https://www.moore.com/ This test is not yet approved or cleared by the https://www.young.biz/ FDA and  has been authorized for detection and/or diagnosis of SARS-CoV-2 by FDA under an Emergency Use Authorization (EUA).  This EUA will remain in effect (meaning this test can be use d) for the duration of  the COVID-19 declaration under Section 564(b)(1) of the Act, 21 U.S.C. section 360bbb-3(b)(1), unless the authorization is terminated or revoked sooner.    Influenza A by PCR NEGATIVE NEGATIVE Final   Influenza B by PCR NEGATIVE NEGATIVE Final    Comment: (NOTE) The Xpert Xpress SARS-CoV-2/FLU/RSV assay is intended as an aid in  the diagnosis of influenza from Nasopharyngeal swab specimens and  should not be used as a sole basis for treatment. Nasal washings and  aspirates are unacceptable for Xpert Xpress SARS-CoV-2/FLU/RSV  testing. Fact Sheet for Patients: [place] Fact Sheet for Healthcare Providers: https://www.moore.com/ This test is not yet approved or cleared by the https://www.young.biz/ FDA and  has been authorized for detection and/or diagnosis of SARS-CoV-2 by  FDA under an Emergency Use Authorization (EUA). This EUA will remain  in effect (  meaning this test can be used) for the duration of the  Covid-19 declaration under Section 564(b)(1) of the Act, 21  U.S.C. section 360bbb-3(b)(1), unless the authorization is  terminated or revoked. Performed at [facility], [address] [place], [place] [address]      Labs: BNP (last 3 results) Recent Labs    12/22/19 0150  BNP 31.8   Basic Metabolic Panel: Recent Labs  Lab 12/22/19 0150  NA 138  K 3.7  CL 104  CO2 26  GLUCOSE 75  BUN 17  CREATININE 0.67  CALCIUM 9.1   Liver Function Tests: Recent Labs  Lab 12/22/19 0150  AST 14*  ALT 20  ALKPHOS 88  BILITOT 0.4  PROT 7.1  ALBUMIN 3.9   No results for  input(s): LIPASE, AMYLASE in the last 168 hours. No results for input(s): AMMONIA in the last 168 hours. CBC: Recent Labs  Lab 12/22/19 0150  WBC 8.6  NEUTROABS 4.8  HGB 12.8  HCT 39.7  MCV 83.9  PLT 301   Cardiac Enzymes: No results for input(s): CKTOTAL, CKMB, CKMBINDEX, TROPONINI in the last 168 hours. BNP: Invalid input(s): POCBNP CBG: No results for input(s): GLUCAP in the last 168 hours. D-Dimer Recent Labs    12/22/19 0150  DDIMER <0.27   Hgb A1c No results for input(s): HGBA1C in the last 72 hours. Lipid Profile No results for input(s): CHOL, HDL, LDLCALC, TRIG, CHOLHDL, LDLDIRECT in the last 72 hours. Thyroid function studies No results for input(s): TSH, T4TOTAL, T3FREE, THYROIDAB in the last 72 hours.  Invalid input(s): FREET3 Anemia work up [employer]    12/22/19 0150  FERRITIN 34   Urinalysis No results found for: COLORURINE, APPEARANCEUR, LABSPEC, PHURINE, GLUCOSEU, HGBUR, BILIRUBINUR, KETONESUR, PROTEINUR, UROBILINOGEN, NITRITE, LEUKOCYTESUR Sepsis Labs Invalid input(s): PROCALCITONIN,  WBC,  LACTICIDVEN Microbiology Recent Results (from the past 240 hour(s))  Respiratory Panel by RT PCR (Flu A&B, Covid) - Nasopharyngeal Swab     Status: Abnormal   Collection Time: 12/21/19  3:25 PM   Specimen: Nasopharyngeal Swab  Result Value Ref Range Status   SARS Coronavirus 2 by RT PCR POSITIVE (A) NEGATIVE Final    Comment: RESULT CALLED TO, READ BACK BY AND VERIFIED WITH: [name],[name] @2011  12/21/19 BILLINGSLEY,L (NOTE) SARS-CoV-2 target nucleic acids are DETECTED. SARS-CoV-2 RNA is generally detectable in upper respiratory specimens  during the acute phase of infection. Positive results are indicative of the presence of the identified virus, but do not rule out bacterial infection or co-infection with other pathogens not detected by the test. Clinical correlation with patient history and other diagnostic information is necessary to determine  patient infection status. The expected result is Negative. Fact Sheet for Patients:  12/23/19 Fact Sheet for Healthcare Providers: https://www.moore.com/ This test is not yet approved or cleared by the https://www.young.biz/ FDA and  has been authorized for detection and/or diagnosis of SARS-CoV-2 by FDA under an Emergency Use Authorization (EUA).  This EUA will remain in effect (meaning this test can be use d) for the duration of  the COVID-19 declaration under Section 564(b)(1) of the Act, 21 U.S.C. section 360bbb-3(b)(1), unless the authorization is terminated or revoked sooner.    Influenza A by PCR NEGATIVE NEGATIVE Final   Influenza B by PCR NEGATIVE NEGATIVE Final    Comment: (NOTE) The Xpert Xpress SARS-CoV-2/FLU/RSV assay is intended as an aid in  the diagnosis of influenza from Nasopharyngeal swab specimens and  should not be used as a sole basis  for treatment. Nasal washings and  aspirates are unacceptable for Xpert Xpress SARS-CoV-2/FLU/RSV  testing. Fact Sheet for Patients: PinkCheek.be Fact Sheet for Healthcare Providers: GravelBags.it This test is not yet approved or cleared by the [place] FDA and  has been authorized for detection and/or diagnosis of SARS-CoV-2 by  FDA under an Emergency Use Authorization (EUA). This EUA will remain  in effect (meaning this test can be used) for the duration of the  Covid-19 declaration under Section 564(b)(1) of the Act, 21  U.S.C. section 360bbb-3(b)(1), unless the authorization is  terminated or revoked. Performed at [facility], [place] [address], [place], [place] [address]      Time coordinating discharge in minutes: 11  SIGNED:   Debbe Odea, MD  [facility] 12/22/2019, 10:41 AM

## 2019-12-22 NOTE — Discharge Instructions (Signed)
Continue to isolate yourself from others due to your COVID infection for a minimal of 10 days.   You were cared for by a hospitalist during your hospital stay. If you have any questions about your discharge medications or the care you received while you were in the hospital after you are discharged, you can call the unit and asked to speak with the hospitalist on call if the hospitalist that took care of you is not available. Once you are discharged, your primary care physician will handle any further medical issues.   Please note that NO REFILLS for any discharge medications will be authorized once you are discharged, as it is imperative that you return to your primary care physician (or establish a relationship with a primary care physician if you do not have one) for your aftercare needs so that they can reassess your need for medications and monitor your lab values.  Please take all your medications with you for your next visit with your Primary MD. Please ask your Primary MD to get all Hospital records sent to his/her office. Please request your Primary MD to go over all hospital test results at the follow up.   If you experience worsening of your admission symptoms, develop shortness of breath, chest pain, suicidal or homicidal thoughts or a life threatening emergency, you must seek medical attention immediately by calling 911 or calling your MD.   Bonita Quin must read the complete instructions/literature along with all the possible adverse reactions/side effects for all the medicines you take including new medications that have been prescribed to you. Take new medicines after you have completely understood and accpet all the possible adverse reactions/side effects.    Do not drive when taking pain medications or sedatives.     Do not take more than prescribed Pain, Sleep and Anxiety Medications   If you have smoked or chewed Tobacco in the last 2 yrs please stop. Stop any regular alcohol  and or  recreational drug use.   Wear Seat belts while driving.

## 2019-12-22 NOTE — Plan of Care (Signed)
Patient discharged to home via wheelchair. Discharge teaching completed and included: new med regimen, S&S to report/return with immediately, follow-up appointment information and covid isolation instructions. Patient verbalizes understanding of all d/c instructions and asks appropriate questions.     Problem: Education: Goal: Knowledge of risk factors and measures for prevention of condition will improve Outcome: Adequate for Discharge   Problem: Respiratory: Goal: Will maintain a patent airway Outcome: Adequate for Discharge Goal: Complications related to the disease process, condition or treatment will be avoided or minimized Outcome: Adequate for Discharge

## 2019-12-22 NOTE — Consult Note (Signed)
Telepsych Consultation   Reason for Consult: "Suicidal thoughts" Referring Physician:  Dr Butler Denmark Location of Patient: [name]  Location of Provider: [facility]  Patient Identification: Anne Duarte MRN:  [id number] Principal Diagnosis: Anne Duarte suicidal thoughts Diagnosis:  Principal Problem:   Verbalizes suicidal thoughts Active Problems:   MDD (major depressive disorder), recurrent severe, without psychosis (HCC)   COVID-19 virus infection   Obesity (BMI 35.0-39.9 without comorbidity)   Total Time spent with patient: 30 minutes  Subjective:   Anne Duarte is a 24 y.o. female patient admitted with suicidal ideations.  Patient assessed by nurse practitioner.  Patient alert and oriented, answers appropriately.  Patient denies suicidal and homicidal ideations today.  Patient denies history of suicide attempts, denies history of self-harm.  Patient denies auditory and visual hallucinations, patient denies symptoms of paranoia.  Patient reports intermittent alcohol use, "sometimes on a weekend."  Patient drug use.  Patient denies access to weapons. Patient reports she is currently living on campus with 3 roommates.  Patient reports stressors include school "I have been going to class and I have poor grades."  Patient also endorses stressors of pandemic and "my aunt's dog died and it was my breaking point." Patient followed outpatient by Dr. Nolen Mu her psychiatrist.  Patient reports her sleep is okay and her appetite is "fine."  Patient current medications include Seroquel 200 nightly.  Patient reports compliance with medication. Patient found to be Covid positive, currently asymptomatic. Patient plans to go home to her parents home.  Patient reports her father works from home.  Patient gives verbal consent to speak with Father Vilma Meckel phone number [phone]. Spoke with patient's father Anne Duarte: Patient's father denies concerns for patient safety.   Patient's father participated in safety planning including securing all medications including over-the-counter medications.  Patient's father denies access to weapons.  Patient's father denies history of suicide attempts or self-harm.  HPI: Patient admitted with suicidal ideations, found to be Covid positive.  Past Psychiatric History: Depression  Risk to Self: Is patient at risk for suicide?: Yes currently denies Risk to Others:  Denies Prior Inpatient Therapy:  Denies Prior Outpatient Therapy:  Yes  Past Medical History:  Past Medical History:  Diagnosis Date  . Anxiety     Past Surgical History:  Procedure Laterality Date  . TYMPANOSTOMY TUBE PLACEMENT    . WISDOM TOOTH EXTRACTION     Family History:  Family History  Problem Relation Age of Onset  . Diabetes Mother   . Heart murmur Mother    Family Psychiatric  History: Mother-depression Social History:  Social History   Substance and Sexual Activity  Alcohol Use No     Social History   Substance and Sexual Activity  Drug Use No    Social History   Socioeconomic History  . Marital status: Single    Spouse name: Not on file  . Number of children: Not on file  . Years of education: Not on file  . Highest education level: Not on file  Occupational History  . Not on file  Tobacco Use  . Smoking status: Never Smoker  . Smokeless tobacco: Never Used  Substance and Sexual Activity  . Alcohol use: No  . Drug use: No  . Sexual activity: Never  Other Topics Concern  . Not on file  Social History Narrative  . Not on file   Social Determinants of Health   Financial Resource Strain:   . Difficulty of Paying Living  Expenses: Not on file  Food Insecurity:   . Worried About Programme researcher, broadcasting/film/video in the Last Year: Not on file  . Ran Out of Food in the Last Year: Not on file  Transportation Needs:   . Lack of Transportation (Medical): Not on file  . Lack of Transportation (Non-Medical): Not on file  Physical  Activity:   . Days of Exercise per Week: Not on file  . Minutes of Exercise per Session: Not on file  Stress:   . Feeling of Stress : Not on file  Social Connections:   . Frequency of Communication with Friends and Family: Not on file  . Frequency of Social Gatherings with Friends and Family: Not on file  . Attends Religious Services: Not on file  . Active Member of Clubs or Organizations: Not on file  . Attends Banker Meetings: Not on file  . Marital Status: Not on file   Additional Social History:    Allergies:   Allergies  Allergen Reactions  . Minipress [Prazosin] Hypertension  . Prazosin Hcl Other (See Comments)    Rapid heart beat/out of breath    Labs:  Results for orders placed or performed during the hospital encounter of 12/21/19 (from the past 48 hour(s))  Rapid urine drug screen (hospital performed)     Status: None   Collection Time: 12/21/19 11:28 PM  Result Value Ref Range   Opiates NONE DETECTED NONE DETECTED   Cocaine NONE DETECTED NONE DETECTED   Benzodiazepines NONE DETECTED NONE DETECTED   Amphetamines NONE DETECTED NONE DETECTED   Tetrahydrocannabinol NONE DETECTED NONE DETECTED   Barbiturates NONE DETECTED NONE DETECTED    Comment: (NOTE) DRUG SCREEN FOR MEDICAL PURPOSES ONLY.  IF CONFIRMATION IS NEEDED FOR ANY PURPOSE, NOTIFY LAB WITHIN 5 DAYS. LOWEST DETECTABLE LIMITS FOR URINE DRUG SCREEN Drug Class                     Cutoff (ng/mL) Amphetamine and metabolites    1000 Barbiturate and metabolites    200 Benzodiazepine                 200 Tricyclics and metabolites     300 Opiates and metabolites        300 Cocaine and metabolites        300 THC                            50 Performed at [facility], [address] [place], [place] [address]   Brain natriuretic peptide     Status: None   Collection Time: 12/22/19  1:50 AM  Result Value Ref Range   B Natriuretic Peptide 31.8 0.0 - 100.0 pg/mL     Comment: Performed at [facility], [address], [place], [place] [address]  C-reactive protein     Status: Abnormal   Collection Time: 12/22/19  1:50 AM  Result Value Ref Range   CRP 1.0 (H) <1.0 mg/dL    Comment: Performed at [facility], [address], [place], [place] [address]  Comprehensive metabolic panel     Status: Abnormal   Collection Time: 12/22/19  1:50 AM  Result Value Ref Range   Sodium 138 135 - 145 mmol/L   Potassium 3.7 3.5 - 5.1 mmol/L   Chloride 104 98 - 111 mmol/L   CO2 26 22 - 32 mmol/L   Glucose, Bld  75 70 - 99 mg/dL   BUN 17 6 - 20 mg/dL   Creatinine, Ser 3.82 0.44 - 1.00 mg/dL   Calcium 9.1 8.9 - 50.5 mg/dL   Total Protein 7.1 6.5 - 8.1 g/dL   Albumin 3.9 3.5 - 5.0 g/dL   AST 14 (L) 15 - 41 U/L   ALT 20 0 - 44 U/L   Alkaline Phosphatase 88 38 - 126 U/L   Total Bilirubin 0.4 0.3 - 1.2 mg/dL   GFR calc non Af Amer >60 >60 mL/min   GFR calc Af Amer >60 >60 mL/min   Anion gap 8 5 - 15    Comment: Performed at [facility], [address] [place], [place] [address]  CBC with Differential/Platelet     Status: Abnormal   Collection Time: 12/22/19  1:50 AM  Result Value Ref Range   WBC 8.6 4.0 - 10.5 K/uL   RBC 4.73 3.87 - 5.11 MIL/uL   Hemoglobin 12.8 12.0 - 15.0 g/dL   HCT 34.1 93.7 - 90.2 %   MCV 83.9 80.0 - 100.0 fL   MCH 27.1 26.0 - 34.0 pg   MCHC 32.2 30.0 - 36.0 g/dL   RDW 40.9 73.5 - 32.9 %   Platelets 301 150 - 400 K/uL   nRBC 0.0 0.0 - 0.2 %   Neutrophils Relative % 55 %   Neutro Abs 4.8 1.7 - 7.7 K/uL   Lymphocytes Relative 30 %   Lymphs Abs 2.6 0.7 - 4.0 K/uL   Monocytes Relative 7 %   Monocytes Absolute 0.6 0.1 - 1.0 K/uL   Eosinophils Relative 7 %   Eosinophils Absolute 0.6 (H) 0.0 - 0.5 K/uL   Basophils Relative 1 %   Basophils Absolute 0.1 0.0 - 0.1 K/uL   Immature Granulocytes 0 %   Abs Immature Granulocytes 0.03 0.00 - 0.07 K/uL    Comment: Performed at [facility], [address], [place], [place] [address]  D-dimer, quantitative (not at [facility])     Status: None   Collection Time: 12/22/19  1:50 AM  Result Value Ref Range   D-Dimer, Quant <0.27 0.00 - 0.50 ug/mL-FEU    Comment: (NOTE) At the manufacturer cut-off of 0.50 ug/mL FEU, this assay has been documented to exclude PE with a sensitivity and negative predictive value of 97 to 99%.  At this time, this assay has not been approved by the FDA to exclude DVT/VTE. Results should be correlated with clinical presentation. Performed at [facility], [address], [place], [place] [address]   Ferritin     Status: None   Collection Time: 12/22/19  1:50 AM  Result Value Ref Range   Ferritin 34 11 - 307 ng/mL    Comment: Performed at [facility], [address], [place], [place] [address]  Lactate dehydrogenase     Status: None   Collection Time: 12/22/19  1:50 AM  Result Value Ref Range   LDH 152 98 - 192 U/L    Comment: Performed at [facility], [address], [place], [place] [address]  Procalcitonin     Status: None   Collection Time: 12/22/19  1:50 AM  Result Value Ref Range   Procalcitonin <0.10 ng/mL    Comment:        Interpretation: PCT (Procalcitonin) <= 0.5 ng/mL: Systemic infection (sepsis) is not likely. Local bacterial infection is possible. (NOTE)       Sepsis PCT Algorithm  Lower Respiratory Tract                                      Infection PCT Algorithm    ----------------------------     ----------------------------         PCT < 0.25 ng/mL                PCT < 0.10 ng/mL         Strongly encourage             Strongly discourage   discontinuation of antibiotics    initiation of antibiotics    ----------------------------     -----------------------------       PCT 0.25 - 0.50 ng/mL            PCT 0.10 - 0.25 ng/mL               OR       >80% decrease in PCT            Discourage  initiation of                                            antibiotics      Encourage discontinuation           of antibiotics    ----------------------------     -----------------------------         PCT >= 0.50 ng/mL              PCT 0.26 - 0.50 ng/mL               AND        <80% decrease in PCT             Encourage initiation of                                             antibiotics       Encourage continuation           of antibiotics    ----------------------------     -----------------------------        PCT >= 0.50 ng/mL                  PCT > 0.50 ng/mL               AND         increase in PCT                  Strongly encourage                                      initiation of antibiotics    Strongly encourage escalation           of antibiotics                                     -----------------------------  PCT <= 0.25 ng/mL                                                 OR                                        > 80% decrease in PCT                                     Discontinue / Do not initiate                                             antibiotics Performed at [facility], [address] [place], [place] [id number]     Medications:  Current Facility-Administered Medications  Medication Dose Route Frequency Provider Last Rate Last Admin  . acetaminophen (TYLENOL) tablet 650 mg  650 mg Oral Q6H PRN Coletta MemosVera, Trinity, MD      . ascorbic acid (VITAMIN C) tablet 500 mg  500 mg Oral Daily Coletta MemosVera, Trinity, MD   500 mg at 12/22/19 [id number]  . diphenhydrAMINE (BENADRYL) capsule 25 mg  25 mg Oral Q6H PRN Coletta MemosVera, Trinity, MD      . docusate sodium (COLACE) capsule 100 mg  100 mg Oral Daily Coletta MemosVera, Trinity, MD      . enoxaparin (LOVENOX) injection 40 mg  40 mg Subcutaneous Q24H Coletta MemosVera, Trinity, MD   40 mg at 12/21/19 2339  . guaiFENesin-dextromethorphan (ROBITUSSIN DM) 100-10 MG/5ML syrup 10 mL  10 mL Oral Q4H PRN Coletta MemosVera,  Trinity, MD      . Ipratropium-Albuterol (COMBIVENT) respimat 1 puff  1 puff Inhalation Q6H PRN Coletta MemosVera, Trinity, MD      . ondansetron (ZOFRAN) tablet 4 mg  4 mg Oral Q6H PRN Coletta MemosVera, Trinity, MD       Or  . ondansetron [facility](ZOFRAN) injection 4 mg  4 mg Intravenous Q6H PRN Coletta MemosVera, Trinity, MD      . QUEtiapine (SEROQUEL) tablet 50 mg  50 mg Oral QHS Coletta MemosVera, Trinity, MD   50 mg at 12/21/19 2344  . zinc sulfate capsule 220 mg  220 mg Oral Daily Coletta MemosVera, Trinity, MD   220 mg at 12/22/19 [id number]    Musculoskeletal: Strength & Muscle Tone: within normal limits Gait & Station: normal Patient leans: N/A  Psychiatric Specialty Exam: Physical Exam  Nursing note and vitals reviewed. Constitutional: She is oriented to person, place, and time. She appears well-developed and well-nourished. No distress.  HENT:  Head: Normocephalic.  Cardiovascular: Normal rate.  Respiratory: Effort normal.  Musculoskeletal:        General: Normal range of motion.     Cervical back: Normal range of motion.  Neurological: She is alert and oriented to person, place, and time.  Psychiatric: She has a normal mood and affect. Her speech is normal and behavior is normal. Judgment and thought content normal. Cognition and memory are normal.    Review of Systems  Constitutional: Negative.   HENT: Negative.   Eyes: Negative.   Respiratory: Negative.   Cardiovascular: Negative.  Gastrointestinal: Negative.   Genitourinary: Negative.   Musculoskeletal: Negative.   Skin: Negative.   Neurological: Negative.     Blood pressure 105/65, pulse 78, temperature 98.7 F (37.1 C), temperature source Oral, resp. rate 18, height 5\' 5"  (1.651 m), weight 99.8 kg, SpO2 98 %.Body mass index is 36.61 kg/m.  General Appearance: Casual and Fairly Groomed  Eye Contact:  Good  Speech:  Clear and Coherent and Normal Rate  Volume:  Normal  Mood:  Euthymic  Affect:  Appropriate and Congruent  Thought Process:  Coherent, Goal Directed and Descriptions  of Associations: Intact  Orientation:  Full (Time, Place, and Person)  Thought Content:  WDL and Logical  Suicidal Thoughts:  No  Homicidal Thoughts:  No  Memory:  Immediate;   Good Recent;   Good Remote;   Good  Judgement:  Intact  Insight:  Good  Psychomotor Activity:  Normal  Concentration:  Concentration: Good and Attention Span: Good  Recall:  Good  Fund of Knowledge:  Good  Language:  Good  Akathisia:  No  Handed:  Right  AIMS (if indicated):     Assets:  Communication Skills Desire for Improvement Financial Resources/Insurance Housing Intimacy Leisure Time [place] Talents/Skills Transportation Vocational/Educational  ADL's:  Intact  Cognition:  WNL  Sleep:        Treatment Plan Summary: Case discussed with Dr. Dwyane Dee. Follow up with outpatient psychiatry, Dr Caprice Beaver  Disposition: No evidence of imminent risk to self or others at present.   Patient does not meet criteria for psychiatric inpatient admission. Supportive therapy provided about ongoing stressors. Discussed crisis plan, support from social network, calling 911, coming to the Emergency Department, and calling Suicide Hotline.  This service was provided via telemedicine using a 2-way, interactive audio and video technology.  Names of all persons participating in this telemedicine service and their role in this encounter. Name: Anne Duarte Role: Patient  Name: Anne Duarte telephone Role: Patient's father  Name: Letitia Libra Role: [place], [place] 12/22/2019 10:29 AM

## 2019-12-22 NOTE — Progress Notes (Signed)
PROGRESS NOTE    Anne Duarte   XKP:[id number]  DOB: 09-21-1996  DOA: 12/21/2019 PCP: Patient, No Pcp Per   Brief Narrative:  Anne Duarte  is a 24 y.o. female, with obesity, depression, presents as a transfer from the [facility] where she presented with suicidal ideation, tested positive for SARS-CoV-2 and subsequently transferred to [facility] for management.  Patient is a Ship broker at [employer], she has history of MDD with suicidal ideations, was admitted to the [facility] in 2018.  She reports that she takes Seroquel 200 mg nightly for sleep.  She used to be on Zoloft, she weaned herself off in 08/2019.  Admits to suicidal ideation, she had considered overdosing on medication.  She denies access to firearms.  He has Seroquel as prescribed by her psychiatrist, she does not recall the last time she had an appointment.   Reports that her father had SARS-CoV-2 infection in January, she had a negative PCR 11/23/2019.  She admits to rhinorrhea for last few days.  She denies any cough, shortness of breath, chest pain, palpitations, orthopnea, PND.  She denies any fever, chills or night sweats.  She denies any nausea, vomiting, diarrhea.        Subjective: She has a mildly runny nose but no cough or dyspnea. She feels depressed but not as severe as before. As of this AM, she has no thoughts of self harm.    Assessment & Plan:   Principal Problem:   Verbalizes suicidal thoughts/ MDD (major depressive disorder), recurrent severe, without psychosis - she presented voluntarily to [facility] with her parents due to depression with thoughts of suicide suck as overdose or cutting herself - continue Engineer, materials- she will return to behavioral health when able- I feel she is medically clear to return - currently on no medications - she states she weaned herself off of Zoloft last fall -  a psychiatry consult has been requested already  Active Problems:     COVID-19 virus  infection - minimal symptoms- we are treating symptomatically only    Obesity (BMI 35.0-39.9 without comorbidity) Body mass index is 36.61 kg/m.   Time spent in minutes: 35 DVT prophylaxis: Lovenox Code Status: Full code Family Communication:  Disposition Plan: possible transfer to [facility]- she is medically stable for discharge to [facility] Consultants:   Awaiting psych Procedures:   none Antimicrobials:  Anti-infectives (From admission, onward)   None       Objective: Vitals:   12/21/19 2214 12/21/19 2251 12/22/19 0432 12/22/19 0700  BP: 115/75 115/75 (!) 101/57 105/65  Pulse: 94 94 83 78  Resp: 17 17 14 18   Temp: 98.1 F (36.7 C) 98.1 F (36.7 C) 98.3 F (36.8 C) 98.7 F (37.1 C)  TempSrc: Oral Oral Oral Oral  SpO2: 99%  98% 98%  Weight:  99.8 kg    Height:  5\' 5"  (1.651 m)      Intake/Output Summary (Last 24 hours) at 12/22/2019 0955 Last data filed at 12/22/2019 0300 Gross per 24 hour  Intake 330 ml  Output --  Net 330 ml   Filed Weights   12/21/19 2251  Weight: 99.8 kg    Examination: General exam: Appears comfortable  HEENT: PERRLA, oral mucosa moist, no sclera icterus or thrush Respiratory system: Clear to auscultation. Respiratory effort normal. Cardiovascular system: S1 & S2 heard, RRR.   Gastrointestinal system: Abdomen soft, non-tender, nondistended. Normal bowel sounds. Central nervous system: Alert and oriented. No focal neurological  deficits. Extremities: No cyanosis, clubbing or edema Skin: No rashes or ulcers Psychiatry:  Mood & affect appropriate. - does not appear depressed at this moment    Data Reviewed: I have personally reviewed following labs and imaging studies  CBC: Recent Labs  Lab 12/22/19 0150  WBC 8.6  NEUTROABS 4.8  HGB 12.8  HCT 39.7  MCV 83.9  PLT 301   Basic Metabolic Panel: Recent Labs  Lab 12/22/19 0150  NA 138  K 3.7  CL 104  CO2 26  GLUCOSE 75  BUN 17  CREATININE 0.67  CALCIUM 9.1   GFR: Estimated  Creatinine Clearance: 130.1 mL/min (by C-G formula based on SCr of 0.67 mg/dL). Liver Function Tests: Recent Labs  Lab 12/22/19 0150  AST 14*  ALT 20  ALKPHOS 88  BILITOT 0.4  PROT 7.1  ALBUMIN 3.9   No results for input(s): LIPASE, AMYLASE in the last 168 hours. No results for input(s): AMMONIA in the last 168 hours. Coagulation Profile: No results for input(s): INR, PROTIME in the last 168 hours. Cardiac Enzymes: No results for input(s): CKTOTAL, CKMB, CKMBINDEX, TROPONINI in the last 168 hours. BNP (last 3 results) No results for input(s): PROBNP in the last 8760 hours. HbA1C: No results for input(s): HGBA1C in the last 72 hours. CBG: No results for input(s): GLUCAP in the last 168 hours. Lipid Profile: No results for input(s): CHOL, HDL, LDLCALC, TRIG, CHOLHDL, LDLDIRECT in the last 72 hours. Thyroid Function Tests: No results for input(s): TSH, T4TOTAL, FREET4, T3FREE, THYROIDAB in the last 72 hours. Anemia Panel: Recent Labs    12/22/19 0150  FERRITIN 34   Urine analysis: No results found for: COLORURINE, APPEARANCEUR, LABSPEC, PHURINE, GLUCOSEU, HGBUR, BILIRUBINUR, KETONESUR, PROTEINUR, UROBILINOGEN, NITRITE, LEUKOCYTESUR Sepsis Labs: @LABRCNTIP (procalcitonin:4,lacticidven:4) ) Recent Results (from the past 240 hour(s))  Respiratory Panel by RT PCR (Flu A&B, Covid) - Nasopharyngeal Swab     Status: Abnormal   Collection Time: 12/21/19  3:25 PM   Specimen: Nasopharyngeal Swab  Result Value Ref Range Status   SARS Coronavirus 2 by RT PCR POSITIVE (A) NEGATIVE Final    Comment: RESULT CALLED TO, READ BACK BY AND VERIFIED WITH: [name],[name] @2011  12/21/19 BILLINGSLEY,L (NOTE) SARS-CoV-2 target nucleic acids are DETECTED. SARS-CoV-2 RNA is generally detectable in upper respiratory specimens  during the acute phase of infection. Positive results are indicative of the presence of the identified virus, but do not rule out bacterial infection or co-infection with other  pathogens not detected by the test. Clinical correlation with patient history and other diagnostic information is necessary to determine patient infection status. The expected result is Negative. Fact Sheet for Patients:  Fact Sheet for Healthcare Providers: 12/23/19 This test is not yet approved or cleared by the https://www.moore.com/ FDA and  has been authorized for detection and/or diagnosis of SARS-CoV-2 by FDA under an Emergency Use Authorization (EUA).  This EUA will remain in effect (meaning this test can be use d) for the duration of  the COVID-19 declaration under Section 564(b)(1) of the Act, 21 U.S.C. section 360bbb-3(b)(1), unless the authorization is terminated or revoked sooner.    Influenza A by PCR NEGATIVE NEGATIVE Final   Influenza B by PCR NEGATIVE NEGATIVE Final    Comment: (NOTE) The Xpert Xpress SARS-CoV-2/FLU/RSV assay is intended as an aid in  the diagnosis of influenza from Nasopharyngeal swab specimens and  should not be used as a sole basis for treatment. Nasal washings and  aspirates are unacceptable for Xpert Xpress  SARS-CoV-2/FLU/RSV  testing. Fact Sheet for Patients: https://www.moore.com/ Fact Sheet for Healthcare Providers: https://www.young.biz/ This test is not yet approved or cleared by the [place] FDA and  has been authorized for detection and/or diagnosis of SARS-CoV-2 by  FDA under an Emergency Use Authorization (EUA). This EUA will remain  in effect (meaning this test can be used) for the duration of the  Covid-19 declaration under Section 564(b)(1) of the Act, 21  U.S.C. section 360bbb-3(b)(1), unless the authorization is  terminated or revoked. Performed at [facility], [address] [place], [place] [address]          Radiology Studies: No results found.    Scheduled Meds: . vitamin C  500 mg  Oral Daily  . docusate sodium  100 mg Oral Daily  . enoxaparin (LOVENOX) injection  40 mg Subcutaneous Q24H  . QUEtiapine  50 mg Oral QHS  . zinc sulfate  220 mg Oral Daily   Continuous Infusions:   LOS: 1 day      Calvert Cantor, MD [facility] Pager: [email or web address] 12/22/2019, 9:55 AM

## 2020-12-23 ENCOUNTER — Other Ambulatory Visit: Payer: Self-pay

## 2020-12-23 ENCOUNTER — Encounter: Payer: Self-pay | Admitting: Family

## 2020-12-23 ENCOUNTER — Ambulatory Visit: Payer: PRIVATE HEALTH INSURANCE | Admitting: Family

## 2020-12-23 VITALS — BP 119/82 | HR 90 | Ht 65.63 in | Wt 238.8 lb

## 2020-12-23 DIAGNOSIS — F411 Generalized anxiety disorder: Secondary | ICD-10-CM | POA: Diagnosis not present

## 2020-12-23 DIAGNOSIS — Z7689 Persons encountering health services in other specified circumstances: Secondary | ICD-10-CM | POA: Diagnosis not present

## 2020-12-23 DIAGNOSIS — F332 Major depressive disorder, recurrent severe without psychotic features: Secondary | ICD-10-CM

## 2020-12-23 NOTE — Patient Instructions (Signed)
Return for annual physical examination, labs, and health maintenance. Arrive fasting meaning having had no food and/or nothing to drink for at least 8 hours prior to appointment. Referral to Psychiatry. [facility] [address], [place] [address] Phone # [phone] Walk-Ins Welcome Thank you for choosing Primary Care at [facility] for your medical home!    Anne Duarte was seen by Rema Fendt, NP today.   Kavin Leech [place]'s primary care provider is Rema Fendt, NP.   For the best care possible,  you should try to see Ricky Stabs, NP whenever you come to clinic.   We look forward to seeing you again soon!  If you have any questions about your visit today,  please call us at [phone]  Or feel free to reach your provider via MyChart.   Major Depressive Disorder, Adult Major depressive disorder is a mental health condition. This disorder affects feelings. It can also affect the body. Symptoms of this condition last most of the day, almost every day, for 2 weeks. This disorder can affect:  Relationships.  Daily activities, such as work and school.  Activities that you normally like to do. What are the causes? The cause of this condition is not known. The disorder is likely caused by a mix of things, including:  Your personality, such as being a shy person.  Your behavior, or how you act toward others.  Your thoughts and feelings.  Too much alcohol or drugs.  How you react to stress.  Health and mental problems that you have had for a long time.  Things that hurt you in the past (trauma).  Big changes in your life, such as divorce. What increases the risk? The following factors may make you more likely to develop this condition:  Having family members with depression.  Being a woman.  Problems in the family.  Low levels of some brain chemicals.  Things that caused you pain as a child, especially if you lost a parent  or were abused.  A lot of stress in your life, such as from: ? Living without basic needs of life, such as food and shelter. ? Being treated poorly because of race, sex, or religion (discrimination).  Health and mental problems that you have had for a long time. What are the signs or symptoms? The main symptoms of this condition are:  Being sad all the time.  Being grouchy all the time.  Loss of interest in things and activities. Other symptoms include:  Sleeping too much or too little.  Eating too much or too little.  Gaining or losing weight, without knowing why.  Feeling tired or having low energy.  Being restless and weak.  Feeling hopeless, worthless, or guilty.  Trouble thinking clearly or making decisions.  Thoughts of hurting yourself or others, or thoughts of ending your life.  Spending a lot of time alone.  Inability to complete common tasks of daily life. If you have very bad MDD, you may:  Believe things that are not true.  Hear, see, taste, or feel things that are not there.  Have mild depression that lasts for at least 2 years.  Feel very sad and hopeless.  Have trouble speaking or moving. How is this treated? This condition may be treated with:  Talk therapy. This teaches you to know bad thoughts, feelings, and actions and how to change them. ? This can also help you to communicate with others. ?  This can be done with members of your family.  Medicines. These can be used to treat worry (anxiety), depression, or low levels of chemicals in the brain.  Lifestyle changes. You may need to: ? Limit alcohol use. ? Limit drug use. ? Get regular exercise. ? Get plenty of sleep. ? Make healthy eating choices. ? Spend more time outdoors.  Brain stimulation. This treatment excites the brain. This is done when symptoms are very bad or have not gotten better with other treatments. Follow these instructions at home: Activity  Get regular exercise as  told.  Spend time outdoors as told.  Make time to do the things you enjoy.  Find ways to deal with stress. Try to: ? Meditate. ? Do deep breathing. ? Spend time in nature. ? Keep a journal.  Return to your normal activities as told by your doctor. Ask your doctor what activities are safe for you. Alcohol and drug use  If you drink alcohol: ? Limit how much you use to:  0-1 drink a day for women.  0-2 drinks a day for men. ? Be aware of how much alcohol is in your drink. In the [place] one drink equals one 12 oz bottle of beer (355 mL), one 5 oz glass of wine (148 mL), or one 1 oz glass of hard liquor (44 mL).  Talk to your doctor about: ? Alcohol use. Alcohol can affect some medicines. ? Any drug use. General instructions  Take over-the-counter and prescription medicines and herbal preparations only as told by your doctor.  Eat a healthy diet.  Get a lot of sleep.  Think about joining a support group. Your doctor may be able to suggest one.  Keep all follow-up visits as told by your doctor. This is important.   Where to find more information:  [employer] on Mental Illness: www.nami.org  [place] [employer] of Mental Health: http://www.maynard.net/  American Psychiatric Association: www.psychiatry.org/patients-families/ Contact a doctor if:  Your symptoms get worse.  You get new symptoms. Get help right away if:  You hurt yourself.  You have serious thoughts about hurting yourself or others.  You see, hear, taste, smell, or feel things that are not there. If you ever feel like you may hurt yourself or others, or have thoughts about taking your own life, get help right away. Go to your nearest emergency department or:  Call your local emergency services (911 in the [place]  Call a suicide crisis helpline, such as the National Suicide Prevention Lifeline at 704-802-4623. This is open 24 hours a day in the [place]  Text the Crisis Text Line at 915 624 3036 (in the  [place] Summary  Major depressive disorder is a mental health condition. This disorder affects feelings. Symptoms of this condition last most of the day, almost every day, for 2 weeks.  The symptoms of this disorder can cause problems with relationships and with daily activities.  There are treatments and support for people who get this disorder. You may need more than one type of treatment.  Get help right away if you have serious thoughts about hurting yourself or others. This information is not intended to replace advice given to you by your health care provider. Make sure you discuss any questions you have with your health care provider. Document Revised: 09/26/2019 Document Reviewed: 09/26/2019 Elsevier Patient Education  2021 [employer].

## 2020-12-23 NOTE — Progress Notes (Signed)
Establish care  Needs to [facility] refill been out for about for about 1 year

## 2020-12-23 NOTE — Progress Notes (Signed)
Subjective:    Anne Duarte - 25 y.o. adult MRN [id number]  Date of birth: 1996-02-07  HPI  Anne Duarte is to establish care. 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    Current issues and/or concerns: Anxiety and depression primarily related to fear of something happening to her or her family. The pandemic has made it difficult to attend college courses so she decided to withdraw. She was also meeting with a counselor regularly on campus but sessions were put on hold because she tested positive for Covid. Was taking Zoloft for depression but was weaned off of it related to mood swings. Difficulty sleeping at night. History of parannoia of leaving the house.     ROS per HPI   Health Maintenance:  Health Maintenance Due  Topic Date Due  . Hepatitis C Screening  Never done  . COVID-19 Vaccine (1) Never done  . HIV Screening  Never done  . TETANUS/TDAP  Never done  . PAP-Cervical Cytology Screening  Never done  . PAP SMEAR-Modifier  Never done  . INFLUENZA VACCINE  Never done     Past Medical History: Patient Active Problem List   Diagnosis Date Noted  . COVID-19 virus infection 12/21/2019  . Obesity (BMI 35.0-39.9 without comorbidity) 12/21/2019  . MDD (major depressive disorder), recurrent severe, without psychosis (HCC) 07/04/2017  . MDD (major depressive disorder), single episode, severe , no psychosis (HCC) 07/04/2017    Social History   reports that Anne Duarte has never smoked. Anne Duarte has never used smokeless tobacco. Anne Duarte. Convey reports that Anne Duarte does not drink alcohol and does not use drugs.   Family History  family history includes Diabetes in [place]. [place]'s mother; Heart murmur in [place]. [place]'s mother.   Medications: reviewed and updated   Objective:   Physical Exam BP 119/82 (BP Location: Left Arm, Patient Position:  Sitting)   Pulse 90   Ht 5' 5.63" (1.667 m)   Wt 238 lb 12.8 oz (108.3 kg)   SpO2 97%   BMI 38.98 kg/m  Physical Exam Constitutional:      Appearance: Anne Duarte is obese.  HENT:     Head: Normocephalic and atraumatic.  Eyes:     Extraocular Movements: Extraocular movements intact.     Pupils: Pupils are equal, round, and reactive to light.  Cardiovascular:     Rate and Rhythm: Normal rate and regular rhythm.     Pulses: Normal pulses.     Heart sounds: Normal heart sounds.  Pulmonary:     Effort: Pulmonary effort is normal.     Breath sounds: Normal breath sounds.  Musculoskeletal:     Cervical back: Normal range of motion and neck supple.  Neurological:     General: No focal deficit present.     Mental Status: Anne Duarte is alert and oriented to person, place, and time.  Psychiatric:        Mood and Affect: Mood normal.        Behavior: Behavior normal.       Assessment & Plan:  1. Encounter to establish care: - Patient presents today to establish care.  - Return for annual physical examination, labs, and health maintenance. Arrive fasting meaning having had no food and/or nothing to drink for at least 8 hours prior to appointment.  2. MDD (major depressive disorder), recurrent severe, without psychosis (HCC):  3. Generalized anxiety disorder: - Chronic. - Stable.  - Denies thoughts of self-harm, suicidal ideations, and homicidal ideations.  - Referral to the [facility] for further evaluation and management.  - Follow-up with primary provider as needed.  - Ambulatory referral to Psychiatry   Patient was given clear instructions to go to Emergency Department or return to medical center if symptoms don't improve, worsen, or new problems develop.The patient verbalized understanding.  I discussed the assessment and treatment plan with the patient. The patient was provided an opportunity to ask questions and all were answered. The  patient agreed with the plan and demonstrated an understanding of the instructions.   The patient was advised to call back or seek an in-person evaluation if the symptoms worsen or if the condition fails to improve as anticipated.    Ricky Stabs, NP 12/24/2020, 9:38 PM Primary Care at [facility]

## 2021-01-30 ENCOUNTER — Other Ambulatory Visit: Payer: Self-pay

## 2021-01-30 ENCOUNTER — Ambulatory Visit (INDEPENDENT_AMBULATORY_CARE_PROVIDER_SITE_OTHER): Payer: PRIVATE HEALTH INSURANCE | Admitting: Family

## 2021-01-30 ENCOUNTER — Encounter: Payer: Self-pay | Admitting: Family

## 2021-01-30 VITALS — BP 125/79 | HR 83 | Ht 65.63 in | Wt 234.8 lb

## 2021-01-30 DIAGNOSIS — Z1329 Encounter for screening for other suspected endocrine disorder: Secondary | ICD-10-CM

## 2021-01-30 DIAGNOSIS — Z130 Encounter for screening for diseases of the blood and blood-forming organs and certain disorders involving the immune mechanism: Secondary | ICD-10-CM | POA: Diagnosis not present

## 2021-01-30 DIAGNOSIS — Z124 Encounter for screening for malignant neoplasm of cervix: Secondary | ICD-10-CM

## 2021-01-30 DIAGNOSIS — Z0000 Encounter for general adult medical examination without abnormal findings: Secondary | ICD-10-CM

## 2021-01-30 DIAGNOSIS — Z13220 Encounter for screening for lipoid disorders: Secondary | ICD-10-CM

## 2021-01-30 DIAGNOSIS — Z114 Encounter for screening for human immunodeficiency virus [HIV]: Secondary | ICD-10-CM

## 2021-01-30 DIAGNOSIS — Z13228 Encounter for screening for other metabolic disorders: Secondary | ICD-10-CM

## 2021-01-30 DIAGNOSIS — Z131 Encounter for screening for diabetes mellitus: Secondary | ICD-10-CM | POA: Diagnosis not present

## 2021-01-30 DIAGNOSIS — Z5320 Procedure and treatment not carried out because of patient's decision for unspecified reasons: Secondary | ICD-10-CM

## 2021-01-30 DIAGNOSIS — Z1159 Encounter for screening for other viral diseases: Secondary | ICD-10-CM

## 2021-01-30 NOTE — Progress Notes (Signed)
Patient ID: KIONI STAHL, adult    DOB: 1996/01/28  MRN: [id number]  CC: Annual Physical Exam  Subjective: Anne Duarte is a 25 y.o. adult who presents for annual physical exam. Anne Duarte's concerns today include: none  Patient Active Problem List   Diagnosis Date Noted  . COVID-19 virus infection 12/21/2019  . Obesity (BMI 35.0-39.9 without comorbidity) 12/21/2019  . MDD (major depressive disorder), recurrent severe, without psychosis (HCC) 07/04/2017  . MDD (major depressive disorder), single episode, severe , no psychosis (HCC) 07/04/2017     Current Outpatient Medications on File Prior to Visit  Medication Sig Dispense Refill  . QUEtiapine (SEROQUEL) 100 MG tablet Take 100 mg by mouth at bedtime.     No current facility-administered medications on file prior to visit.    Allergies  Allergen Reactions  . Other   . Prazosin Hypertension and Other (See Comments)    Other reaction(s): Hypertension Rapid heart beat/out of breath  . Prazosin Hcl Other (See Comments)    Rapid heart beat/out of breath    Social History   Socioeconomic History  . Marital status: Single    Spouse name: Not on file  . Number of children: Not on file  . Years of education: Not on file  . Highest education level: Not on file  Occupational History  . Not on file  Tobacco Use  . Smoking status: Never Smoker  . Smokeless tobacco: Never Used  Vaping Use  . Vaping Use: Never used  Substance and Sexual Activity  . Alcohol use: No  . Drug use: No  . Sexual activity: Never  Other Topics Concern  . Not on file  Social History Narrative  . Not on file   Social Determinants of Health   Financial Resource Strain: Not on file  Food Insecurity: Not on file  Transportation Needs: Not on file  Physical Activity: Not on file  Stress: Not on file  Social Connections: Not on file  Intimate Partner Violence: Not on file    Family History  Problem Relation Age of Onset  . Diabetes  Mother   . Heart murmur Mother     Past Surgical History:  Procedure Laterality Date  . TYMPANOSTOMY TUBE PLACEMENT    . WISDOM TOOTH EXTRACTION      ROS: Review of Systems Negative except as stated above  PHYSICAL EXAM: BP 125/79 (BP Location: Left Arm, Patient Position: Sitting)   Pulse 83   Ht 5' 5.63" (1.667 m)   Wt 234 lb 12.8 oz (106.5 kg)   SpO2 96%   BMI 38.33 kg/m   Physical Exam Exam conducted with a chaperone present.  HENT:     Head: Normocephalic and atraumatic.     Right Ear: Tympanic membrane, ear canal and external ear normal.     Left Ear: Tympanic membrane, ear canal and external ear normal.     Nose: Nose normal.     Mouth/Throat:     Mouth: Mucous membranes are moist.     Pharynx: Oropharynx is clear.  Eyes:     Extraocular Movements: Extraocular movements intact.     Conjunctiva/sclera: Conjunctivae normal.     Pupils: Pupils are equal, round, and reactive to light.  Cardiovascular:     Rate and Rhythm: Normal rate and regular rhythm.     Pulses: Normal pulses.     Heart sounds: Normal heart sounds.  Pulmonary:     Effort: Pulmonary effort is normal.  Breath sounds: Normal breath sounds.  Chest:  Breasts:     Right: Normal.     Left: Normal.      Comments: Margorie John, CMA present during examination.  Abdominal:     General: Bowel sounds are normal.     Palpations: Abdomen is soft.  Genitourinary:    Comments: Patient declined examination. Musculoskeletal:        General: Normal range of motion.     Cervical back: Normal range of motion and neck supple.  Skin:    General: Skin is warm and dry.     Capillary Refill: Capillary refill takes less than 2 seconds.  Neurological:     General: No focal deficit present.     Mental Status: Anne Duarte is alert and oriented to person, place, and time.  Psychiatric:        Mood and Affect: Mood normal.        Behavior: Behavior normal.     ASSESSMENT AND PLAN: 1. Annual physical  exam: - Counseled on 150 minutes of exercise per week as tolerated, healthy eating (including decreased daily intake of saturated fats, cholesterol, added sugars, sodium), STI prevention, and routine healthcare maintenance.  2. Screening for metabolic disorder: - CMP to check kidney function, liver function, and electrolyte balance.  - Comprehensive metabolic panel  3. Screening for deficiency anemia: - CBC to screen for anemia. - CBC  4. Diabetes mellitus screening: - Hemoglobin A1c to screen for pre-diabetes/diabetes. - Hemoglobin A1c  5. Screening cholesterol level: - Lipid panel to screen for high cholesterol.  - Lipid panel  6. Thyroid disorder screen: - TSH to check thyroid function.  - TSH+T4F+T3Free  7. Need for hepatitis C screening test: - Hepatitis C antibody to screen for hepatitis C.  - Hepatitis C Antibody  8. Encounter for screening for HIV: - HIV antibody to screen for human immunodeficiency virus.  - HIV antibody (with reflex)  9. Cervical cancer screening declined: - Declined.   Patient was given the opportunity to ask questions.  Patient verbalized understanding of the plan and was able to repeat key elements of the plan. Patient was given clear instructions to go to Emergency Department or return to medical center if symptoms don't improve, worsen, or new problems develop.The patient verbalized understanding.   Orders Placed This Encounter  Procedures  . Hepatitis C Antibody  . HIV antibody (with reflex)  . Lipid panel  . TSH+T4F+T3Free  . Hemoglobin A1c    Requested Prescriptions    No prescriptions requested or ordered in this encounter    Follow-up with primary provider as scheduled.   Rema Fendt, NP

## 2021-01-30 NOTE — Progress Notes (Signed)
Physical

## 2021-02-01 LAB — LIPID PANEL
Chol/HDL Ratio: 4 ratio (ref 0.0–4.4)
Cholesterol, Total: 213 mg/dL — ABNORMAL HIGH (ref 100–199)
HDL: 53 mg/dL (ref >39)
LDL Chol Calc (NIH): 138 mg/dL — ABNORMAL HIGH (ref 0–99)
Triglycerides: 121 mg/dL (ref 0–149)
VLDL Cholesterol Cal: 22 mg/dL (ref 5–40)

## 2021-02-01 LAB — HEPATITIS C ANTIBODY: Hep C Virus Ab: <0.1 s/co ratio (ref 0.0–0.9)

## 2021-02-01 LAB — TSH+T4F+T3FREE
Free T4: 1.29 ng/dL (ref 0.82–1.77)
T3, Free: 2.9 pg/mL (ref 2.0–4.4)
TSH: 2.12 u[IU]/mL (ref 0.450–4.500)

## 2021-02-01 LAB — HEMOGLOBIN A1C
Est. average glucose Bld gHb Est-mCnc: <74 mg/dL
Hgb A1c MFr Bld: <4.2 % — ABNORMAL LOW (ref 4.8–5.6)

## 2021-02-01 LAB — HIV ANTIBODY (ROUTINE TESTING W REFLEX): HIV Screen 4th Generation wRfx: NONREACTIVE

## 2021-02-01 NOTE — Progress Notes (Signed)
Thyroid normal.   No diabetes.   Hepatitis C negative.   HIV negative.  Cholesterol higher than expected. High cholesterol may increase risk of heart attack and/or stroke. Consider eating more fruits, vegetables, and lean baked meats such as chicken or fish. Moderate intensity exercise at least 150 minutes as tolerated per week may help as well. Patient encouraged to have rechecked in 6 months.

## 2021-02-02 LAB — COMPREHENSIVE METABOLIC PANEL
ALT: 18 IU/L (ref 0–32)
AST: 22 IU/L (ref 0–40)
Albumin/Globulin Ratio: 1.7 (ref 1.2–2.2)
Albumin: 4.6 g/dL (ref 3.9–5.0)
Alkaline Phosphatase: 112 IU/L (ref 44–121)
BUN/Creatinine Ratio: 15 (ref 9–23)
BUN: 11 mg/dL (ref 6–20)
Bilirubin Total: 0.2 mg/dL (ref 0.0–1.2)
CO2: 14 mmol/L — ABNORMAL LOW (ref 20–29)
Calcium: 10 mg/dL (ref 8.7–10.2)
Chloride: 98 mmol/L (ref 96–106)
Creatinine, Ser: 0.74 mg/dL (ref 0.57–1.00)
Globulin, Total: 2.7 g/dL (ref 1.5–4.5)
Glucose: 64 mg/dL — ABNORMAL LOW (ref 65–99)
Potassium: 5.2 mmol/L (ref 3.5–5.2)
Sodium: 138 mmol/L (ref 134–144)
Total Protein: 7.3 g/dL (ref 6.0–8.5)
eGFR: 117 mL/min/{1.73_m2} (ref >59)

## 2021-02-02 LAB — SPECIMEN STATUS REPORT

## 2021-02-02 NOTE — Progress Notes (Signed)
Kidney function normal.   Liver function normal.

## 2021-06-17 ENCOUNTER — Other Ambulatory Visit: Payer: Self-pay

## 2021-06-17 ENCOUNTER — Emergency Department: Payer: BC Managed Care – PPO

## 2021-06-17 ENCOUNTER — Encounter: Payer: Self-pay | Admitting: Emergency Medicine

## 2021-06-17 ENCOUNTER — Emergency Department
Admission: EM | Admit: 2021-06-17 | Discharge: 2021-06-17 | Disposition: A | Discharge status: Home / Self Care | Payer: BC Managed Care – PPO | Attending: Emergency Medicine | Admitting: Emergency Medicine

## 2021-06-17 DIAGNOSIS — K219 Gastro-esophageal reflux disease without esophagitis: Secondary | ICD-10-CM | POA: Insufficient documentation

## 2021-06-17 DIAGNOSIS — Z8616 Personal history of COVID-19: Secondary | ICD-10-CM | POA: Diagnosis not present

## 2021-06-17 DIAGNOSIS — R072 Precordial pain: Secondary | ICD-10-CM | POA: Diagnosis present

## 2021-06-17 LAB — BASIC METABOLIC PANEL
Anion gap: 9 (ref 5–15)
BUN: 14 mg/dL (ref 6–20)
CO2: 24 mmol/L (ref 22–32)
Calcium: 9 mg/dL (ref 8.9–10.3)
Chloride: 103 mmol/L (ref 98–111)
Creatinine, Ser: 0.63 mg/dL (ref 0.44–1.00)
GFR, Estimated: >60 mL/min (ref >60)
Glucose, Bld: 104 mg/dL — ABNORMAL HIGH (ref 70–99)
Potassium: 3.6 mmol/L (ref 3.5–5.1)
Sodium: 136 mmol/L (ref 135–145)

## 2021-06-17 LAB — CBC
HCT: 40.5 % (ref 36.0–46.0)
Hemoglobin: 13.5 g/dL (ref 12.0–15.0)
MCH: 27.1 pg (ref 26.0–34.0)
MCHC: 33.3 g/dL (ref 30.0–36.0)
MCV: 81.2 fL (ref 80.0–100.0)
Platelets: 292 10*3/uL (ref 150–400)
RBC: 4.99 MIL/uL (ref 3.87–5.11)
RDW: 14 % (ref 11.5–15.5)
WBC: 7.9 10*3/uL (ref 4.0–10.5)
nRBC: 0 % (ref 0.0–0.2)

## 2021-06-17 LAB — POC URINE PREG, ED: Preg Test, Ur: NEGATIVE

## 2021-06-17 LAB — TROPONIN I (HIGH SENSITIVITY)
Troponin I (High Sensitivity): <2 ng/L (ref <18)
Troponin I (High Sensitivity): <2 ng/L (ref <18)

## 2021-06-17 LAB — HEPATIC FUNCTION PANEL
ALT: 16 U/L (ref 0–44)
AST: 13 U/L — ABNORMAL LOW (ref 15–41)
Albumin: 3.9 g/dL (ref 3.5–5.0)
Alkaline Phosphatase: 77 U/L (ref 38–126)
Bilirubin, Direct: 0.1 mg/dL (ref 0.0–0.2)
Indirect Bilirubin: 0.8 mg/dL (ref 0.3–0.9)
Total Bilirubin: 0.9 mg/dL (ref 0.3–1.2)
Total Protein: 6.9 g/dL (ref 6.5–8.1)

## 2021-06-17 LAB — LIPASE, BLOOD: Lipase: 38 U/L (ref 11–51)

## 2021-06-17 MED ORDER — SUCRALFATE 1 G PO TABS: 1.0000 g | ORAL_TABLET | Freq: Three times a day (TID) | ORAL | 0 refills | Status: AC

## 2021-06-17 MED ORDER — LIDOCAINE VISCOUS HCL 2 % MT SOLN
15.0000 mL | Freq: Once | OROMUCOSAL | Status: AC
  Administered 2021-06-17: 15 mL via ORAL
  Filled 2021-06-17: qty 15

## 2021-06-17 MED ORDER — ALUM & MAG HYDROXIDE-SIMETH 200-200-20 MG/5ML PO SUSP
30.0000 mL | Freq: Once | ORAL | Status: AC
  Administered 2021-06-17: 30 mL via ORAL
  Filled 2021-06-17: qty 30

## 2021-06-17 MED ORDER — PANTOPRAZOLE SODIUM 20 MG PO TBEC: 20.0000 mg | DELAYED_RELEASE_TABLET | Freq: Every day | ORAL | 0 refills | Status: AC

## 2021-06-17 NOTE — ED Triage Notes (Signed)
Pt c/o sudden onset substernal CP that is burning/aching in nature. Pt states pain x 2 hrs. Pt A&O x4, also reports 1 episode of emesis en route to the hospital.

## 2021-06-17 NOTE — ED Provider Notes (Signed)
[facility] Emergency Department Provider Note  ____________________________________________   Event Date/Time   First MD Initiated Contact with Patient 06/17/21 [phone]     (approximate)  I have reviewed the triage vital signs and the nursing notes.   HISTORY  Chief Complaint Chest Pain    HPI Anne Duarte is a 25 y.o. adult with anxiety, depression who comes in with concerns for chest pain.  Patient reports having substernal chest pain that is burning and aching in nature that started about 2 hours ago.  Patient states that the pain is constant, nothing makes it better or worse.  She reports a little bit of discomfort in her epigastric region but points mostly in her chest.  She denies any birth control, leg swelling, recent long travel, recent surgery or other risk factors for pulmonary embolism.  Denies feeling short of breath.  Her father does have history of reflux.          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 Authorizing Provider  QUEtiapine (SEROQUEL) 100 MG tablet Take 100 mg by mouth at bedtime.    [provider]    Allergies Other, Prazosin, and Prazosin hcl  Family History  Problem Relation Age of Onset   Diabetes Mother    Heart murmur Mother     Social History Social History   Tobacco Use   Smoking status: Never   Smokeless tobacco: Never  Vaping  Use   Vaping Use: Never used  Substance Use Topics   Alcohol use: No   Drug use: No      Review of Systems Constitutional: No fever/chills Eyes: No visual changes. ENT: No sore throat. Cardiovascular: Positive chest pain Respiratory: Denies shortness of breath. Gastrointestinal: Mild epigastric tenderness.  No nausea, with one episode of vomiting.  No diarrhea.  No constipation. Genitourinary: Negative for dysuria. Musculoskeletal: Negative for back pain. Skin: Negative for rash. Neurological: Negative for headaches, focal weakness or numbness. All other ROS negative ____________________________________________   PHYSICAL EXAM:  VITAL SIGNS: ED Triage Vitals  Enc Vitals Group     BP 06/17/21 0609 124/79     Pulse Rate 06/17/21 0609 83     Resp 06/17/21 0609 20     Temp 06/17/21 0609 98 F (36.7 C)     Temp Source 06/17/21 0609 Oral     SpO2 06/17/21 0609 100 %     Weight --      Height --      Head Circumference --      Peak Flow --      Pain Score 06/17/21 0611 5     Pain Loc --      Pain Edu? --      Excl. in GC? --     Constitutional: Alert and oriented. Well appearing and in no acute distress. Eyes: Conjunctivae are normal. EOMI. Head: Atraumatic. Nose: No  congestion/rhinnorhea. Mouth/Throat: Mucous membranes are moist.   Neck: No stridor. Trachea Midline. FROM Cardiovascular: Normal rate, regular rhythm. Grossly normal heart sounds.  Good peripheral circulation. Respiratory: Normal respiratory effort.  No retractions. Lungs CTAB. Gastrointestinal: Soft and nontender. No distention. No abdominal bruits.  Musculoskeletal: No lower extremity tenderness nor edema.  No joint effusions. Neurologic:  Normal speech and language. No gross focal neurologic deficits are appreciated.  Skin:  Skin is warm, dry and intact. No rash noted. Psychiatric: Mood and affect are normal. Speech and behavior are normal. GU: Deferred    ____________________________________________   LABS (all labs ordered are listed, but only abnormal results are displayed)  Labs Reviewed  BASIC METABOLIC PANEL - Abnormal; Notable for the following components:      Result Value   Glucose, Bld 104 (*)    All other components within normal limits  CBC  POC URINE PREG, ED  TROPONIN I (HIGH SENSITIVITY)  TROPONIN I (HIGH SENSITIVITY)   ____________________________________________   ED ECG REPORT I, Concha Se, the attending physician, personally viewed and interpreted this ECG.  Normal sinus rate of 96, no ST elevation, no T wave inversions, normal intervals ____________________________________________  RADIOLOGY Vela Prose, personally viewed and evaluated these images (plain radiographs) as part of my medical decision making, as well as reviewing the written report by the radiologist.  ED MD interpretation: No pneumothorax  Official radiology report(s): DG Chest 2 View  Result Date: 06/17/2021 CLINICAL DATA:  Chest pain for 2 hours EXAM: CHEST - 2 VIEW COMPARISON:  None. FINDINGS: Midline trachea.  Normal heart size and mediastinal contours. Sharp costophrenic angles.  No pneumothorax.  Clear lungs. IMPRESSION: No active cardiopulmonary disease. Electronically Signed   By: Jeronimo Greaves M.D.   On: 06/17/2021 06:51    ____________________________________________   PROCEDURES  Procedure(s) performed (including Critical Care):  .1-3 Lead EKG Interpretation  Date/Time: 06/17/2021 9:29 AM Performed by: Concha Se, MD Authorized by: Concha Se, MD     Interpretation: normal     ECG rate:  90s   ECG rate assessment: normal     Rhythm: sinus rhythm     Ectopy: none     Conduction: normal     ____________________________________________   INITIAL IMPRESSION / ASSESSMENT AND PLAN / ED COURSE   Anne Duarte was evaluated in Emergency Department on 06/17/2021 for the symptoms described in the history of  present illness. Anne Duarte was evaluated in the context of the global COVID-19 pandemic, which necessitated consideration that the patient might be at risk for infection with the SARS-CoV-2 virus that causes COVID-19. Institutional protocols and algorithms that pertain to the evaluation of patients at risk for COVID-19 are in a state of rapid change based on information released by regulatory bodies including the CDC and federal and state organizations. These policies and algorithms were followed during the patient's care in the ED.    Most Likely DDx:  -MSK (atypical chest pain) but will get cardiac markers to evaluate for ACS given risk factors/age -Most likely acid reflux given the burning sensation that she is feeling.  On her abdominal exam she is soft and nontender.  She report a little bit of epigastric tenderness but really nothing when I palpate on her.  We discussed ultrasound for gallbladder pathology but she is stating that really she is not have any pain in her abdomen is more up in her chest like a burning sensation.  We have elected to  start off with GI cocktail and get LFTs, lipase to decide if further imaging is necessary. We will keep on the cardiac monitor.  DDx that was also considered d/t potential to cause harm, but was found less likely based on history and physical (as detailed above): -PNA (no fevers, cough but CXR to evaluate) -PNX (reassured with equal b/l breath sounds, CXR to evaluate) -Symptomatic anemia (will get H&H) -Pulmonary embolism as no sob at rest, not pleuritic in nature, no hypoxia.  PERC negative -Aortic Dissection as no tearing pain and no radiation to the mid back, pulses equal -Pericarditis no rub on exam, EKG changes or hx to suggest dx -Tamponade (no notable SOB, tachycardic, hypotensive) -Esophageal rupture (no h/o diffuse vomitting/no crepitus)  Labs are reassuring.  No evidence of anemia or white count elevation, troponin is negative.  LFTs are  negative.  On repeat assessment patient does not have any abdominal pain.  She states she has complete resolution of her symptoms after GI cocktail.  At this time we will hold off on ultrasound given low suspicion for gallbladder pathology.  She will return to the ER if develops right upper quadrant abdominal pain or any other concerns.  She states the episode of vomiting was more just like 1 time secondary to the pain in her chest.  But she declines any nausea medicine.  We will start patient on PPI, Carafate.  Patient instructed to discontinue caffeine, alcohol, NSAID use, smoking or any other risk factors for GERD  I discussed the provisional nature of ED diagnosis, the treatment so far, the ongoing plan of care, follow up appointments and return precautions with the patient and any family or support people present. They expressed understanding and agreed with the plan, discharged home.        ____________________________________________   FINAL CLINICAL IMPRESSION(S) / ED DIAGNOSES   Final diagnoses:  Gastroesophageal reflux disease without esophagitis     MEDICATIONS GIVEN DURING THIS VISIT:  Medications  alum & mag hydroxide-simeth (MAALOX/MYLANTA) 200-200-20 MG/5ML suspension 30 mL (30 mLs Oral Given 06/17/21 0923)    And  lidocaine (XYLOCAINE) 2 % viscous mouth solution 15 mL (15 mLs Oral Given 06/17/21 6948)     ED Discharge Orders          Ordered    pantoprazole (PROTONIX) 20 MG tablet  Daily        06/17/21 0952    sucralfate (CARAFATE) 1 g tablet  3 times daily with meals & bedtime        06/17/21 5462             Note:  This document was prepared using Dragon voice recognition software and may include unintentional dictation errors.    Concha Se, MD 06/17/21 [phone]

## 2021-06-17 NOTE — Discharge Instructions (Addendum)
Return to the ER for upper abdominal pain, fevers, continuous vomiting or any other concerns.  Otherwise she will use medications.  Use Tylenol instead of ibuprofen, up to 1 g every 8 hours for pain.

## 2022-02-03 IMAGING — CR DG CHEST 2V
2 series · 2 of 2 positions shown · non-contrast
Comparison: None.

CLINICAL DATA: Chest pain for 2 hours

EXAM:
CHEST - 2 VIEW

[chest pa]
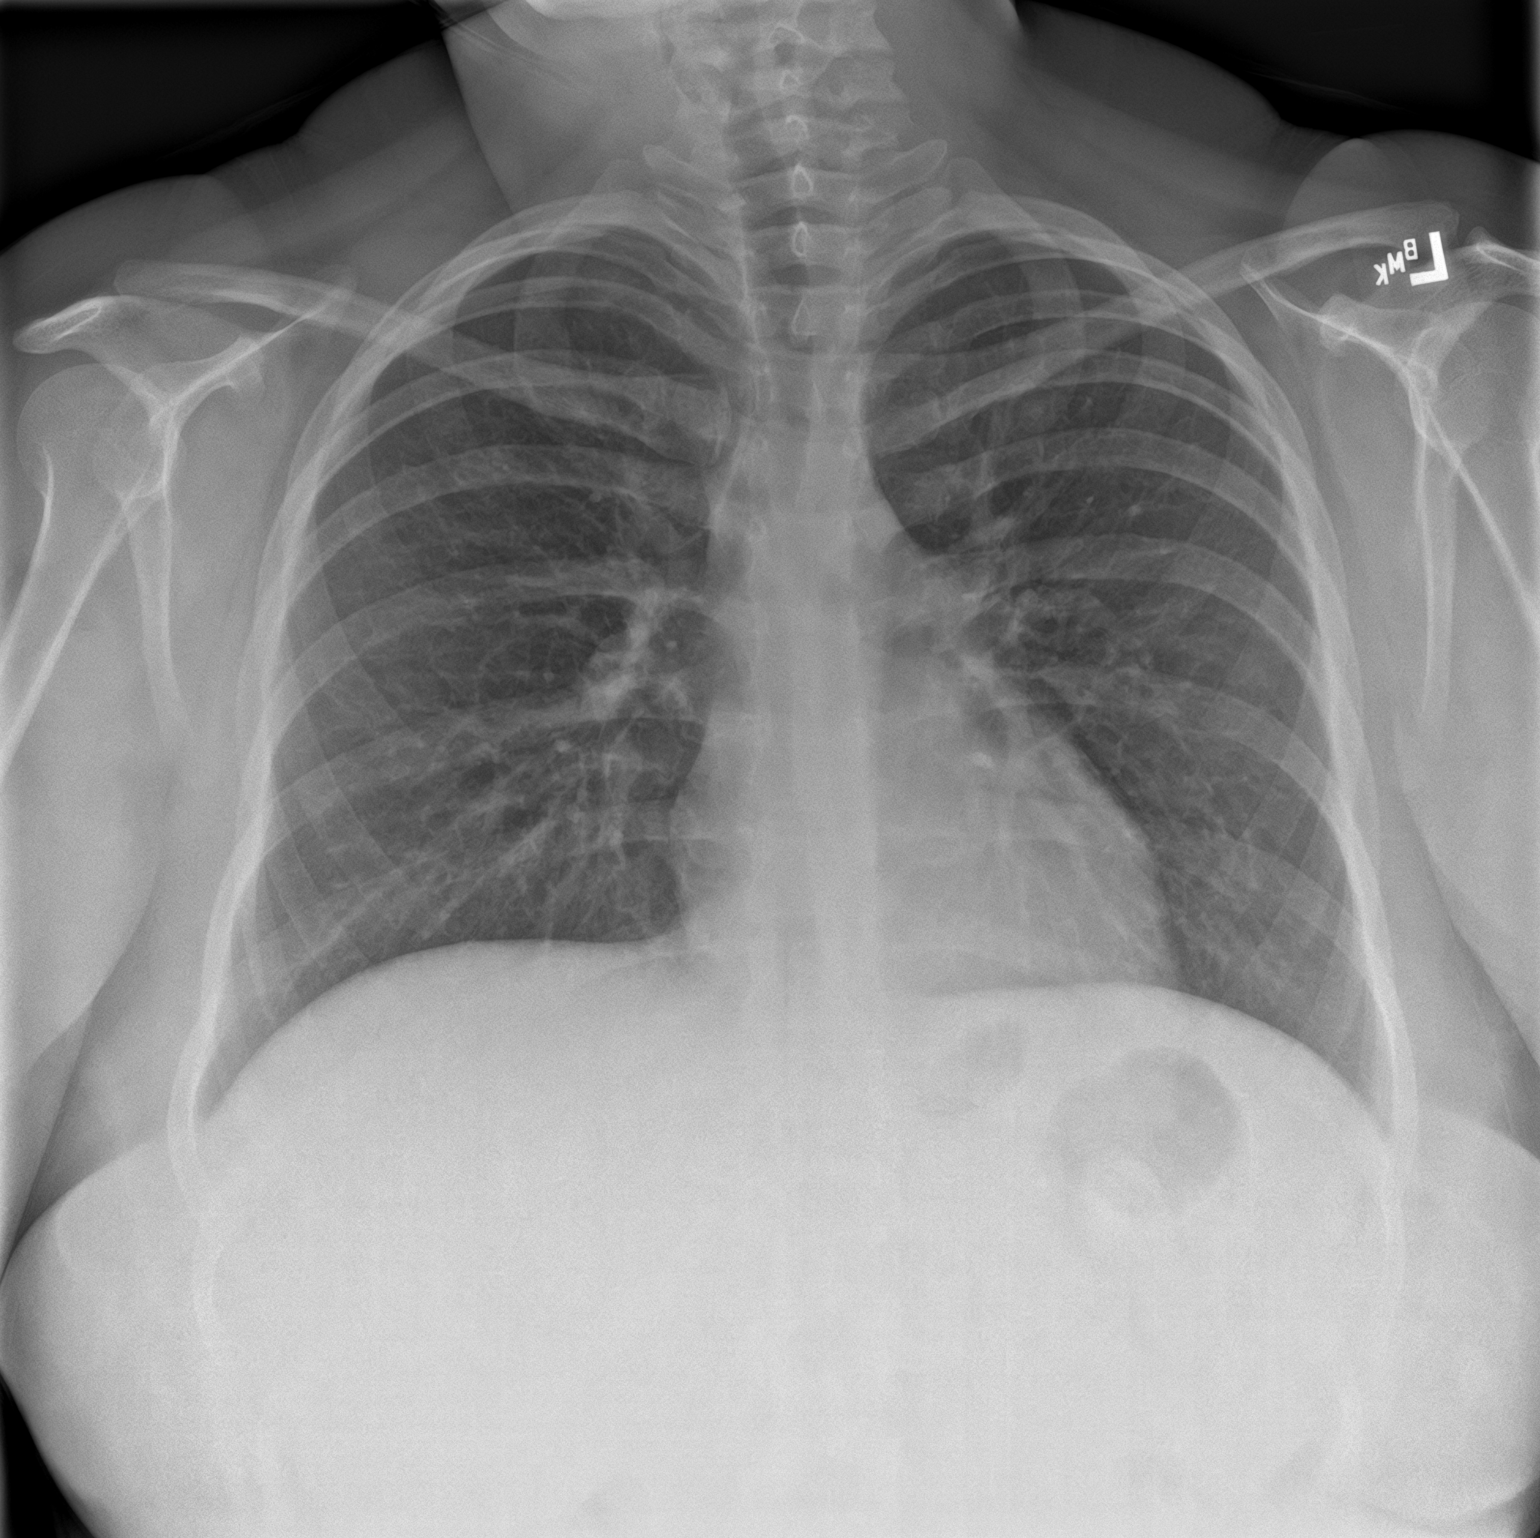

[chest lat]
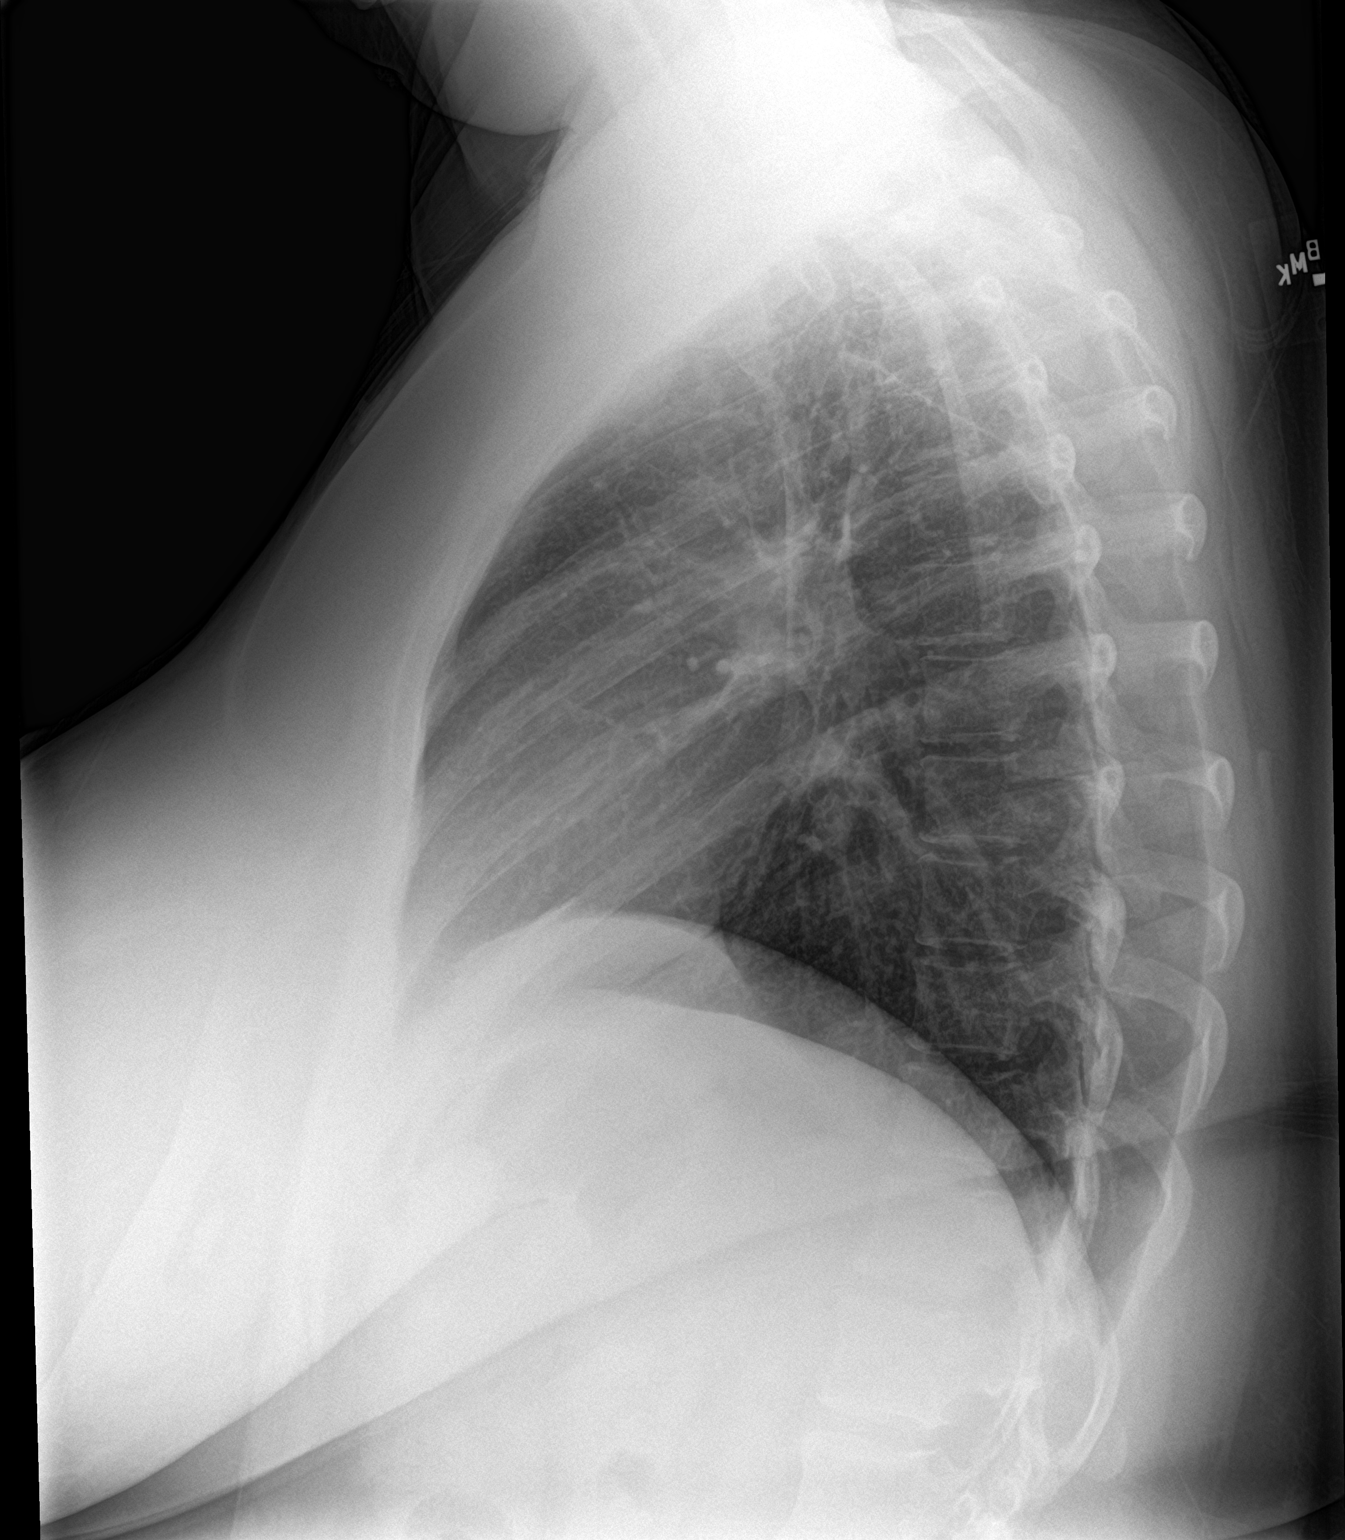

[2 of 2 positions shown; findings below may reference images not displayed]

FINDINGS: Midline trachea.  Normal heart size and mediastinal contours.

Sharp costophrenic angles.  No pneumothorax.  Clear lungs.
IMPRESSION: No active cardiopulmonary disease.

## 2022-11-05 ENCOUNTER — Other Ambulatory Visit: Payer: Self-pay

## 2022-11-05 MED ORDER — LISDEXAMFETAMINE DIMESYLATE 20 MG PO CAPS
20.0000 mg | ORAL_CAPSULE | Freq: Every morning | ORAL | 0 refills | Status: AC
  Filled 2022-11-05: qty 30, 30d supply, fill #0
